# Patient Record
Sex: Female | Born: 1970 | Race: White | Hispanic: No | Marital: Married | Smoking: Former smoker
Health system: Southern US, Community
[De-identification: ages and names within clinical notes are randomized; demographics above are authoritative.]

## PROBLEM LIST (undated history)

## (undated) DIAGNOSIS — R87619 Unspecified abnormal cytological findings in specimens from cervix uteri: Secondary | ICD-10-CM

## (undated) HISTORY — PX: CERVICAL BIOPSY  W/ LOOP ELECTRODE EXCISION: SUR135

## (undated) HISTORY — DX: Unspecified abnormal cytological findings in specimens from cervix uteri: R87.619

## (undated) HISTORY — PX: BACK SURGERY: SHX140

## (undated) HISTORY — PX: TONSILLECTOMY: SHX5217

---

## 1999-07-22 HISTORY — PX: BREAST SURGERY: SHX581

## 2000-07-21 DIAGNOSIS — R87619 Unspecified abnormal cytological findings in specimens from cervix uteri: Secondary | ICD-10-CM

## 2000-07-21 HISTORY — DX: Unspecified abnormal cytological findings in specimens from cervix uteri: R87.619

## 2012-08-17 ENCOUNTER — Encounter (HOSPITAL_COMMUNITY): Payer: Self-pay | Admitting: *Deleted

## 2012-08-17 ENCOUNTER — Emergency Department (HOSPITAL_COMMUNITY)
Admission: EM | Admit: 2012-08-17 | Discharge: 2012-08-17 | Disposition: A | Discharge status: Home / Self Care | Payer: Self-pay

## 2012-08-17 NOTE — ED Notes (Signed)
Spoke with CN, [place], and [place], [facility].  States that since the patient was here for drug screening only and pt did not need medical screening.  Pt should not be billed, bill should be sent to [facility] [place].

## 2012-08-17 NOTE — ED Notes (Signed)
Pt here for pre-employment drug screen for [employer], no other complaints

## 2013-02-14 ENCOUNTER — Ambulatory Visit (INDEPENDENT_AMBULATORY_CARE_PROVIDER_SITE_OTHER): Payer: Managed Care, Other (non HMO) | Admitting: Gynecology

## 2013-02-14 ENCOUNTER — Encounter: Payer: Self-pay | Admitting: Gynecology

## 2013-02-14 VITALS — BP 100/64 | HR 68 | Temp 99.1°F | Resp 16 | Ht 66.25 in | Wt 154.0 lb

## 2013-02-14 DIAGNOSIS — Z01419 Encounter for gynecological examination (general) (routine) without abnormal findings: Secondary | ICD-10-CM

## 2013-02-14 DIAGNOSIS — K5900 Constipation, unspecified: Secondary | ICD-10-CM

## 2013-02-14 DIAGNOSIS — Z0000 Encounter for general adult medical examination without abnormal findings: Secondary | ICD-10-CM

## 2013-02-14 DIAGNOSIS — Z124 Encounter for screening for malignant neoplasm of cervix: Secondary | ICD-10-CM

## 2013-02-14 DIAGNOSIS — D259 Leiomyoma of uterus, unspecified: Secondary | ICD-10-CM

## 2013-02-14 LAB — POCT URINALYSIS DIPSTICK
Leukocytes, UA: NEGATIVE
Protein, UA: NEGATIVE
Urobilinogen, UA: NEGATIVE

## 2013-02-14 MED ORDER — NORETHIN-ETH ESTRAD-FE BIPHAS 1 MG-10 MCG / 10 MCG PO TABS: 1.0000 | ORAL_TABLET | Freq: Every day | ORAL | Status: DC

## 2013-02-14 NOTE — Progress Notes (Signed)
Patient ID: Pamela Chen, female   DOB: 1970/12/13, 42 y.o.   MRN: [id number] 42 y.o. MarriedCaucasian female   G0P0000 here for annual exam. Pt is currently sexually active.  Pt reports history of fibroids and menorrhagia.  Pt states that cycles are very well controled on LOLoestrin and is very happy with it.  Pt denies postcoiotal bleeding. Pt reports history of, eat constipation, had used miralax in past but did not see benefits, used to have bowel movements daily now it can be 1x/week eats a lot of fiber   No LMP recorded. Patient is not currently having periods (Reason: Irregular Periods).          Sexually active: yes  The current method of family planning is OCP (estrogen/progesterone).    Exercising: yes  Pilates at studio, pt teaches water fitness and spin class at [employer], biking and running, golfing, skiing on own time Last pap: 02/2012?, normal per patient, pt has had abnormal pap in past, neg HPV in past Alcohol: 1-2 drinks per week Tobacco: None BSE: sporadic Mammogram: normal, [facility] in [place].    No health maintenance topics applied.  Family History  Problem Relation Age of Onset  . Stroke Maternal Grandfather     There are no active problems to display for this patient.   History reviewed. No pertinent past medical history.  Past Surgical History  Procedure Laterality Date  . Back surgery  2010, 2013    discectomy, laminectomy  . Breast surgery Right 2001    breast reduction  . Tonsillectomy      Allergies: Cephalosporins and Tramadol  Current Outpatient Prescriptions  Medication Sig Dispense Refill  . LO LOESTRIN FE 1 MG-10 MCG / 10 MCG tablet Take 1 tablet by mouth daily. Take continuously      . NON FORMULARY AdvoCare Supplements daily.       No current facility-administered medications for this visit.    ROS: Pertinent items are noted in HPI.  Exam:    BP 100/64  Pulse 68  Temp(Src) 99.1 F (37.3 C) (Oral)  Resp 16  Ht  5' 6.25" (1.683 m)  Wt 154 lb (69.854 kg)  BMI 24.66 kg/m2 Weight change: @WEIGHTCHANGE @ Last 3 height recordings:  Ht Readings from Last 3 Encounters:  02/14/13 5' 6.25" (1.683 m)   General appearance: alert, cooperative and appears stated age Head: Normocephalic, without obvious abnormality, atraumatic Neck: no adenopathy, no carotid bruit, no JVD, supple, symmetrical, trachea midline and thyroid not enlarged, symmetric, no tenderness/mass/nodules Lungs: clear to auscultation bilaterally Breasts: normal appearance, no masses or tenderness Heart: regular rate and rhythm, S1, S2 normal, no murmur, click, rub or gallop Abdomen: soft, non-tender; bowel sounds normal; no masses,  no organomegaly Extremities: extremities normal, atraumatic, no cyanosis or edema Skin: Skin color, texture, turgor normal. No rashes or lesions Lymph nodes: Cervical, supraclavicular, and axillary nodes normal. no inguinal nodes palpated Neurologic: Grossly normal   Pelvic: External genitalia:  no lesions              Urethra: normal appearing urethra with no masses, tenderness or lesions              Bartholins and Skenes: normal                 Vagina: normal appearing vagina with normal color and discharge, no lesions              Cervix: normal appearance  Pap taken: no        Bimanual Exam:  Uterus:  uterus is normal size, shape, consistency and nontender, retroflexed with posterior fibroid                                      Adnexa:    normal adnexa in size, nontender and no masses                                      Rectovaginal: posterior fibroid, no stool in rectum                                      Anus:  normal sphincter tone, no lesions  A: well woman Contraceptive management  Fibroid uterus constipation     P: mammogram annual pap smear will get records of last PAP  U/s to evaluate fibroids, if fibroid is not source of constipation will refer to GI-Pt agreeable Refill  ocp return annually or prn   An After Visit Summary was printed and given to the patient.

## 2013-03-01 ENCOUNTER — Ambulatory Visit (INDEPENDENT_AMBULATORY_CARE_PROVIDER_SITE_OTHER): Payer: Managed Care, Other (non HMO)

## 2013-03-01 ENCOUNTER — Ambulatory Visit (INDEPENDENT_AMBULATORY_CARE_PROVIDER_SITE_OTHER): Payer: Managed Care, Other (non HMO) | Admitting: Gynecology

## 2013-03-01 VITALS — BP 100/60 | HR 72 | Resp 14 | Ht 66.25 in | Wt 152.0 lb

## 2013-03-01 DIAGNOSIS — K5900 Constipation, unspecified: Secondary | ICD-10-CM

## 2013-03-01 DIAGNOSIS — D259 Leiomyoma of uterus, unspecified: Secondary | ICD-10-CM

## 2013-03-01 MED ORDER — LINACLOTIDE 145 MCG PO CAPS: 145.0000 ug | ORAL_CAPSULE | Freq: Every day | ORAL | Status: DC

## 2013-03-01 MED ORDER — NORETHIN-ETH ESTRAD-FE BIPHAS 1 MG-10 MCG / 10 MCG PO TABS: 1.0000 | ORAL_TABLET | Freq: Every day | ORAL | Status: DC

## 2013-03-01 NOTE — Progress Notes (Signed)
     Images reviewed with pt, fibroids are small and not source of constipation.  Otherwise unremarkable. Pt eats a lot of fiber and has used stimulants, we suggest that she either see GI now or trial of Linzess, she prefers the later, 15m rx sent in, she will call and let us know how it works. 2 samples of LoLoestrin also given. PAP records na, will resend request

## 2013-03-03 NOTE — Addendum Note (Signed)
Addended by: Douglass Rivers on: 03/03/2013 04:12 PM   Modules accepted: Orders

## 2013-03-07 LAB — IPS PAP TEST WITH HPV

## 2013-05-04 ENCOUNTER — Encounter: Payer: Self-pay | Admitting: Gynecology

## 2014-02-20 ENCOUNTER — Ambulatory Visit: Payer: Managed Care, Other (non HMO) | Admitting: Gynecology

## 2014-03-05 ENCOUNTER — Other Ambulatory Visit: Payer: Self-pay | Admitting: Gynecology

## 2014-03-06 NOTE — Telephone Encounter (Signed)
Last refilled/AEX 02/14/13 #3 packs with 3 refills Aex scheduled for 03/22/14 with Dr. Allena Earing Loestrin 1/10 #84/0 refills sent to pharmacy to last patient until AEX

## 2014-03-21 ENCOUNTER — Ambulatory Visit: Payer: Managed Care, Other (non HMO) | Admitting: Gynecology

## 2014-03-22 ENCOUNTER — Ambulatory Visit (INDEPENDENT_AMBULATORY_CARE_PROVIDER_SITE_OTHER): Payer: Managed Care, Other (non HMO) | Admitting: Gynecology

## 2014-03-22 ENCOUNTER — Encounter: Payer: Self-pay | Admitting: Gynecology

## 2014-03-22 VITALS — BP 100/78 | HR 64 | Resp 12 | Ht 66.5 in | Wt 153.0 lb

## 2014-03-22 DIAGNOSIS — Z01419 Encounter for gynecological examination (general) (routine) without abnormal findings: Secondary | ICD-10-CM

## 2014-03-22 DIAGNOSIS — Z3041 Encounter for surveillance of contraceptive pills: Secondary | ICD-10-CM

## 2014-03-22 DIAGNOSIS — D259 Leiomyoma of uterus, unspecified: Secondary | ICD-10-CM

## 2014-03-22 DIAGNOSIS — Z0000 Encounter for general adult medical examination without abnormal findings: Secondary | ICD-10-CM

## 2014-03-22 DIAGNOSIS — K5900 Constipation, unspecified: Secondary | ICD-10-CM

## 2014-03-22 LAB — POCT URINALYSIS DIPSTICK
LEUKOCYTES UA: NEGATIVE
Urobilinogen, UA: NEGATIVE
pH, UA: 5

## 2014-03-22 LAB — HEMOGLOBIN, FINGERSTICK: HEMOGLOBIN, FINGERSTICK: 13.2 g/dL (ref 12.0–16.0)

## 2014-03-22 MED ORDER — NORETHIN-ETH ESTRAD-FE BIPHAS 1 MG-10 MCG / 10 MCG PO TABS: 1.0000 | ORAL_TABLET | Freq: Every day | ORAL | Status: DC

## 2014-03-22 NOTE — Patient Instructions (Signed)
otc magnesium supplements for constipation

## 2014-03-22 NOTE — Progress Notes (Signed)
43 y.o. Married Caucasian female   G0P0000 here for annual exam. Pt is currently sexually active.  Happy with ocp-no withdraw bleed. No dyspareunia.  No LMP recorded. Patient is not currently having periods (Reason: Irregular Periods).          Sexually active: Yes.    The current method of family planning is OCP (estrogen/progesterone).    Exercising: Yes.    crossfit, running, water fitness, biking 5x/wk Last pap: 02/14/13 NEG HR HPV Alcohol: 1-2 drinks/wk Tobacco: no BSE: no Mammogram: 04/11/13   Hgb: ; Urine: Negative  Health Maintenance  Topic Date Due  . Tetanus/tdap  01/19/1988  . Influenza Vaccine  02/18/2014  . Pap Smear  02/15/2016    Family History  Problem Relation Age of Onset  . Stroke Maternal Grandfather     Patient Active Problem List   Diagnosis Date Noted  . Fibroid uterus 02/14/2013  . Unspecified constipation 02/14/2013    No past medical history on file.  Past Surgical History  Procedure Laterality Date  . Back surgery  2010, 2013    discectomy, laminectomy  . Breast surgery Right 2001    breast reduction  . Tonsillectomy      Allergies: Cephalosporins and Tramadol  Current Outpatient Prescriptions  Medication Sig Dispense Refill  . LO LOESTRIN FE 1 MG-10 MCG / 10 MCG tablet TAKE 1 TABLET BY MOUTH ONCE DAILY CONTINUSOUSLY AS DIRECTED  84 tablet  0  . NON FORMULARY AdvoCare Supplements daily.      . Linaclotide (LINZESS) 145 MCG CAPS capsule Take 1 capsule (145 mcg total) by mouth daily.  30 capsule  1   No current facility-administered medications for this visit.    ROS: Pertinent items are noted in HPI.  Exam:    BP 100/78  Pulse 64  Resp 12  Ht 5' 6.5" (1.689 m)  Wt 153 lb (69.4 kg)  BMI 24.33 kg/m2 Weight change: @WEIGHTCHANGE @ Last 3 height recordings:  Ht Readings from Last 3 Encounters:  03/22/14 5' 6.5" (1.689 m)  03/01/13 5' 6.25" (1.683 m)  02/14/13 5' 6.25" (1.683 m)   General appearance: alert, cooperative and  appears stated age Head: Normocephalic, without obvious abnormality, atraumatic Neck: no adenopathy, no carotid bruit, no JVD, supple, symmetrical, trachea midline and thyroid not enlarged, symmetric, no tenderness/mass/nodules Lungs: clear to auscultation bilaterally Breasts: normal appearance, no masses or tenderness Heart: regular rate and rhythm, S1, S2 normal, no murmur, click, rub or gallop Abdomen: soft, non-tender; bowel sounds normal; no masses,  no organomegaly Extremities: extremities normal, atraumatic, no cyanosis or edema Skin: Skin color, texture, turgor normal. No rashes or lesions Lymph nodes: Cervical, supraclavicular, and axillary nodes normal. no inguinal nodes palpated Neurologic: Grossly normal   Pelvic: External genitalia:  no lesions              Urethra: normal appearing urethra with no masses, tenderness or lesions              Bartholins and Skenes: Bartholin's, Urethra, Skene's normal                 Vagina: normal appearing vagina with normal color and discharge, no lesions              Cervix: normal appearance              Pap taken: No.        Bimanual Exam:  Uterus:  uterus is normal size, shape, consistency and nontender  Adnexa:    no masses                                      Rectovaginal: Confirms                                      Anus:  normal sphincter tone, no lesions       1. Routine gynecological examination counseled on breast self exam, mammography screening, use and side effects of OCP's, adequate intake of calcium and vitamin D, diet and exercise return annually or prn   2. Laboratory examination ordered as part of a routine general medical examination - POCT Urinalysis Dipstick - Hemoglobin, fingerstick  3. Unspecified constipation Recommend otc magnesium supplements for constipation 4. Encounter for surveillance of contraceptive pills  - Norethindrone-Ethinyl Estradiol-Fe Biphas (LO  LOESTRIN FE) 1 MG-10 MCG / 10 MCG tablet; Take 1 tablet by mouth daily.  Dispense: 3 Package; Refill: 3  5. Uterine leiomyoma, unspecified location  - Norethindrone-Ethinyl Estradiol-Fe Biphas (LO LOESTRIN FE) 1 MG-10 MCG / 10 MCG tablet; Take 1 tablet by mouth daily.  Dispense: 3 Package; Refill: 3  An After Visit Summary was printed and given to the patient.

## 2014-06-21 ENCOUNTER — Telehealth: Payer: Self-pay

## 2014-06-21 NOTE — Telephone Encounter (Signed)
Spoke with pt and has reschedule her AEX with Kem Boroughs on 04/02/15

## 2015-03-27 ENCOUNTER — Ambulatory Visit: Payer: Managed Care, Other (non HMO) | Admitting: Gynecology

## 2015-04-02 ENCOUNTER — Encounter: Payer: Self-pay | Admitting: Nurse Practitioner

## 2015-04-02 ENCOUNTER — Ambulatory Visit (INDEPENDENT_AMBULATORY_CARE_PROVIDER_SITE_OTHER): Payer: Managed Care, Other (non HMO) | Admitting: Nurse Practitioner

## 2015-04-02 VITALS — BP 100/72 | HR 60 | Resp 14 | Ht 66.5 in | Wt 154.0 lb

## 2015-04-02 DIAGNOSIS — D259 Leiomyoma of uterus, unspecified: Secondary | ICD-10-CM

## 2015-04-02 DIAGNOSIS — Z3041 Encounter for surveillance of contraceptive pills: Secondary | ICD-10-CM | POA: Diagnosis not present

## 2015-04-02 DIAGNOSIS — Z01419 Encounter for gynecological examination (general) (routine) without abnormal findings: Secondary | ICD-10-CM | POA: Diagnosis not present

## 2015-04-02 DIAGNOSIS — Z23 Encounter for immunization: Secondary | ICD-10-CM

## 2015-04-02 DIAGNOSIS — Z0000 Encounter for general adult medical examination without abnormal findings: Secondary | ICD-10-CM

## 2015-04-02 LAB — POCT URINALYSIS DIPSTICK
BILIRUBIN UA: NEGATIVE
GLUCOSE UA: NEGATIVE
KETONES UA: NEGATIVE
Leukocytes, UA: NEGATIVE
Nitrite, UA: NEGATIVE
Protein, UA: NEGATIVE
RBC UA: 50
Urobilinogen, UA: NEGATIVE
pH, UA: 5

## 2015-04-02 MED ORDER — NORETHIN-ETH ESTRAD-FE BIPHAS 1 MG-10 MCG / 10 MCG PO TABS: 1.0000 | ORAL_TABLET | Freq: Every day | ORAL | Status: DC

## 2015-04-02 NOTE — Progress Notes (Signed)
44 y.o. G0 Married  Caucasian Fe here for annual exam.  On Lo Loestrin without menses for several years.  Did have a spotting episode within the last year.  Will get increase in warmth especially at night. No vaso symptoms during the day.    Patient's last menstrual period was 07/21/2013 (approximate).          Sexually active: Yes.    The current method of family planning is OCP (estrogen/progesterone).    Exercising: Yes.    crossfit, water fitness, pilates Smoker:  no  Health Maintenance: Pap:  02/14/13 normal, HR HPV neg MMG:  04/14/14 3D dense category c, bi-rads category 1 neg TDaP:  Patient is unsure.   Labs: Patient had labs drawn at work.  UA: RBC 50, Ph 5.0   reports that she quit smoking about 22 years ago. Her smoking use included Cigarettes. She has never used smokeless tobacco. She reports that she drinks about 0.5 - 1.0 oz of alcohol per week. She reports that she does not use illicit drugs.  History reviewed. No pertinent past medical history.  Past Surgical History  Procedure Laterality Date  . Back surgery  2010, 2013    discectomy, laminectomy  . Breast surgery Right 2001    breast reduction  . Tonsillectomy      Current Outpatient Prescriptions  Medication Sig Dispense Refill  . NON FORMULARY AdvoCare Supplements daily.    . Norethindrone-Ethinyl Estradiol-Fe Biphas (LO LOESTRIN FE) 1 MG-10 MCG / 10 MCG tablet Take 1 tablet by mouth daily. 3 Package 3   No current facility-administered medications for this visit.    Family History  Problem Relation Age of Onset  . Stroke Maternal Grandfather     ROS:  Pertinent items are noted in HPI.  Otherwise, a comprehensive ROS was negative.  Exam:   BP 100/72 mmHg  Pulse 60  Resp 14  Ht 5' 6.5" (1.689 m)  Wt 154 lb (69.854 kg)  BMI 24.49 kg/m2  LMP 07/21/2013 (Approximate) Height: 5' 6.5" (168.9 cm) Ht Readings from Last 3 Encounters:  04/02/15 5' 6.5" (1.689 m)  03/22/14 5' 6.5" (1.689 m)  03/01/13 5'  6.25" (1.683 m)    General appearance: alert, cooperative and appears stated age Head: Normocephalic, without obvious abnormality, atraumatic Neck: no adenopathy, supple, symmetrical, trachea midline and thyroid normal to inspection and palpation Lungs: clear to auscultation bilaterally Breasts: normal appearance, no masses or tenderness, surgical changes on the right Heart: regular rate and rhythm Abdomen: soft, non-tender; no masses,  no organomegaly Extremities: extremities normal, atraumatic, no cyanosis or edema Skin: Skin color, texture, turgor normal. No rashes or lesions Lymph nodes: Cervical, supraclavicular, and axillary nodes normal. No abnormal inguinal nodes palpated Neurologic: Grossly normal   Pelvic: External genitalia:  no lesions              Urethra:  normal appearing urethra with no masses, tenderness or lesions              Bartholin's and Skene's: normal                 Vagina: normal appearing vagina with normal color and discharge, no lesions              Cervix: anteverted              Pap taken: No. Bimanual Exam:  Uterus:  normal size, contour, position, consistency, mobility, non-tender  Adnexa: no mass, fullness, tenderness               Rectovaginal: Confirms               Anus:  normal sphincter tone, no lesions  Chaperone present: no  A:  Well Woman with normal exam  Contraception with amenorrhea on OCP  Update immunization  History of uterine fibroids with normal pelvic exam   P:   Reviewed health and wellness pertinent to exam  Pap smear as above  Mammogram is due 9/16  TDaP is given today  Refill on Lo Loestrin for a year  Counseled on breast self exam, mammography screening, use and side effects of OCP's, adequate intake of calcium and vitamin D, diet and exercise, Kegel's exercises return annually or prn  An After Visit Summary was printed and given to the patient.

## 2015-04-02 NOTE — Patient Instructions (Signed)

## 2015-04-04 NOTE — Progress Notes (Signed)
Encounter reviewed by Dr. Vantasia Pinkney Amundson C. Silva.  

## 2015-04-11 ENCOUNTER — Other Ambulatory Visit: Payer: Self-pay

## 2016-03-16 ENCOUNTER — Other Ambulatory Visit: Payer: Self-pay | Admitting: Nurse Practitioner

## 2016-03-16 DIAGNOSIS — D259 Leiomyoma of uterus, unspecified: Secondary | ICD-10-CM

## 2016-03-16 DIAGNOSIS — Z3041 Encounter for surveillance of contraceptive pills: Secondary | ICD-10-CM

## 2016-03-17 NOTE — Telephone Encounter (Signed)
Medication refill request: OCP Last AEX:  04-02-15 Next AEX: 04-07-16 Last MMG (if hormonal medication request): 04-18-15 WNL  Refill authorized: Please advise

## 2016-04-02 ENCOUNTER — Encounter: Payer: Self-pay | Admitting: Nurse Practitioner

## 2016-04-07 ENCOUNTER — Encounter: Payer: Self-pay | Admitting: Nurse Practitioner

## 2016-04-07 ENCOUNTER — Ambulatory Visit (INDEPENDENT_AMBULATORY_CARE_PROVIDER_SITE_OTHER): Payer: Managed Care, Other (non HMO) | Admitting: Nurse Practitioner

## 2016-04-07 VITALS — BP 98/66 | HR 56 | Ht 66.5 in | Wt 158.0 lb

## 2016-04-07 DIAGNOSIS — Z01419 Encounter for gynecological examination (general) (routine) without abnormal findings: Secondary | ICD-10-CM

## 2016-04-07 DIAGNOSIS — Z0000 Encounter for general adult medical examination without abnormal findings: Secondary | ICD-10-CM

## 2016-04-07 DIAGNOSIS — R3915 Urgency of urination: Secondary | ICD-10-CM

## 2016-04-07 DIAGNOSIS — D259 Leiomyoma of uterus, unspecified: Secondary | ICD-10-CM

## 2016-04-07 DIAGNOSIS — Z3041 Encounter for surveillance of contraceptive pills: Secondary | ICD-10-CM

## 2016-04-07 DIAGNOSIS — N760 Acute vaginitis: Secondary | ICD-10-CM

## 2016-04-07 LAB — COMPREHENSIVE METABOLIC PANEL
ALK PHOS: 42 U/L (ref 33–115)
ALT: 16 U/L (ref 6–29)
AST: 18 U/L (ref 10–35)
Albumin: 4 g/dL (ref 3.6–5.1)
BUN: 16 mg/dL (ref 7–25)
CO2: 28 mmol/L (ref 20–31)
CREATININE: 0.79 mg/dL (ref 0.50–1.10)
Calcium: 9.6 mg/dL (ref 8.6–10.2)
Chloride: 105 mmol/L (ref 98–110)
Glucose, Bld: 95 mg/dL (ref 65–99)
POTASSIUM: 5.3 mmol/L (ref 3.5–5.3)
Sodium: 141 mmol/L (ref 135–146)
TOTAL PROTEIN: 6.5 g/dL (ref 6.1–8.1)
Total Bilirubin: 0.5 mg/dL (ref 0.2–1.2)

## 2016-04-07 LAB — URINALYSIS, MICROSCOPIC ONLY
Casts: NONE SEEN [LPF]
Crystals: NONE SEEN [HPF]
YEAST: NONE SEEN [HPF]

## 2016-04-07 LAB — POCT URINALYSIS DIPSTICK
Bilirubin, UA: NEGATIVE
Glucose, UA: NEGATIVE
KETONES UA: NEGATIVE
Nitrite, UA: NEGATIVE
PH UA: 8
PROTEIN UA: NEGATIVE
Urobilinogen, UA: NEGATIVE

## 2016-04-07 LAB — TSH: TSH: 2.08 mIU/L

## 2016-04-07 MED ORDER — NORETHIN-ETH ESTRAD-FE BIPHAS 1 MG-10 MCG / 10 MCG PO TABS: 1.0000 | ORAL_TABLET | Freq: Every day | ORAL | 4 refills | Status: DC

## 2016-04-07 NOTE — Progress Notes (Signed)
Patient ID: Pamela Chen, female   DOB: [birth year], 45 y.o.   MRN: [id number]  45 y.o. G0P0000 Married  Caucasian Fe here for annual exam.  No new health problems.  Current URI and on Z pack.  Some vaginal discharge.  She also is having pelvic pressure with SA for about 4-6 months, seems to be positional.  She has not been exercising for about 3 weeks.  Patient's last menstrual period was 07/21/2013 (approximate).          Sexually active: Yes.    The current method of family planning is oral estrogen/progesterone contraceptive.    Exercising: Yes.    Gym/ health club routine includes water fitness, CrossFit, biking, running. Smoker:  no  Health Maintenance: Pap:  02/14/13 normal, HR HPV neg MMG: 03/31/16, 3D, Bi-Rads 1: Negative TDaP: 04/02/15 HIV: has previously donated blood Labs: Wellness program at work  Urine: trace RBC & Leuk's   reports that she quit smoking about 23 years ago. Her smoking use included Cigarettes. She has never used smokeless tobacco. She reports that she drinks about 0.5 - 1.0 oz of alcohol per week . She reports that she does not use drugs.  History reviewed. No pertinent past medical history.  Past Surgical History:  Procedure Laterality Date  . BACK SURGERY  2010, 2013   discectomy, laminectomy  . BREAST SURGERY Right 2001   breast reduction  . TONSILLECTOMY      Current Outpatient Prescriptions  Medication Sig Dispense Refill  . LO LOESTRIN FE 1 MG-10 MCG / 10 MCG tablet TAKE 1 TABLET BY MOUTH DAILY. 84 tablet 0  . NON FORMULARY AdvoCare Supplements daily.     No current facility-administered medications for this visit.     Family History  Problem Relation Age of Onset  . Stroke Maternal Grandfather   . Breast cancer Maternal Aunt     lumpectomy, chemo, radiation    ROS:  Pertinent items are noted in HPI.  Otherwise, a comprehensive ROS was negative.  Exam:   BP 98/66 (BP Location: Right Arm, Patient Position: Sitting, Cuff Size: Normal)    Pulse (!) 56   Ht 5' 6.5" (1.689 m)   Wt 158 lb (71.7 kg)   LMP 07/21/2013 (Approximate)   BMI 25.12 kg/m  Height: 5' 6.5" (168.9 cm) Ht Readings from Last 3 Encounters:  04/07/16 5' 6.5" (1.689 m)  04/02/15 5' 6.5" (1.689 m)  03/22/14 5' 6.5" (1.689 m)    General appearance: alert, cooperative and appears stated age Head: Normocephalic, without obvious abnormality, atraumatic Neck: no adenopathy, supple, symmetrical, trachea midline and thyroid normal to inspection and palpation Lungs: clear to auscultation bilaterally Breasts: normal appearance, no masses or tenderness Heart: regular rate and rhythm Abdomen: soft, non-tender; no masses,  no organomegaly Extremities: extremities normal, atraumatic, no cyanosis or edema Skin: Skin color, texture, turgor normal. No rashes or lesions Lymph nodes: Cervical, supraclavicular, and axillary nodes normal. No abnormal inguinal nodes palpated Neurologic: Grossly normal   Pelvic: External genitalia:  no lesions              Urethra:  normal appearing urethra with no masses, tenderness or lesions              Bartholin's and Skene's: normal                 Vagina: normal appearing vagina with normal color and white thin discharge, no lesions  Cervix: anteverted              Pap taken: Yes.   Bimanual Exam:  Uterus:  normal size, contour, position, consistency, mobility, non-tender              Adnexa: no mass, fullness, tenderness               Rectovaginal: Confirms               Anus:  normal sphincter tone, no lesions  Chaperone present: yes  A:  Well Woman with normal exam  OCP for menorrhagia and cycle regulation  Pelvic pressure with SA X 4-6 months  History of uterine fibroid and adenomyosis per PUS 2014  P:   Reviewed health and wellness pertinent to exam  Pap smear as above  Mammogram is due 03/2017  Follow with labs and Affirm  Refill Lo Loestrin for a year  Will get PUS and follow  Counseled on breast self  exam, mammography screening, use and side effects of OCP's, adequate intake of calcium and vitamin D, diet and exercise, Kegel's exercises return annually or prn  An After Visit Summary was printed and given to the patient.

## 2016-04-07 NOTE — Patient Instructions (Signed)

## 2016-04-08 ENCOUNTER — Ambulatory Visit: Payer: Managed Care, Other (non HMO)

## 2016-04-08 ENCOUNTER — Other Ambulatory Visit: Payer: Managed Care, Other (non HMO) | Admitting: Obstetrics and Gynecology

## 2016-04-08 ENCOUNTER — Other Ambulatory Visit: Payer: Self-pay

## 2016-04-08 ENCOUNTER — Telehealth: Payer: Self-pay | Admitting: Obstetrics and Gynecology

## 2016-04-08 DIAGNOSIS — D259 Leiomyoma of uterus, unspecified: Secondary | ICD-10-CM

## 2016-04-08 LAB — URINE CULTURE: ORGANISM ID, BACTERIA: NO GROWTH

## 2016-04-08 LAB — WET PREP BY MOLECULAR PROBE
CANDIDA SPECIES: NEGATIVE
GARDNERELLA VAGINALIS: NEGATIVE
TRICHOMONAS VAG: NEGATIVE

## 2016-04-08 LAB — VITAMIN D 25 HYDROXY (VIT D DEFICIENCY, FRACTURES): Vit D, 25-Hydroxy: 37 ng/mL (ref 30–100)

## 2016-04-08 NOTE — Telephone Encounter (Signed)
Patient canceled her PUS/Consult appointment today due to delay in PUS schedule today. Patient would like to be reschedule.

## 2016-04-09 LAB — IPS PAP TEST WITH HPV

## 2016-04-09 NOTE — Telephone Encounter (Signed)
Spoke with patient, she has rescheduled ultrasound appointment for 04/15/16 with Dr Talbert Nan. Patient is aware of the date and time.  Routing to Dr Talbert Nan for review

## 2016-04-10 NOTE — Telephone Encounter (Signed)
Left a message requesting patient to return call to our office. Due to a conflict with the ultrasound schedule we will need to reschedule appointment for 04/15/16.

## 2016-04-11 NOTE — Telephone Encounter (Signed)
Spoke with patient regarding ultrasound scheduled. Patient is agreeable to to re-scheduling. Patient has re-scheduled on 04/17/16 with Dr Talbert Nan.  Routing to Dr Talbert Nan to review

## 2016-04-11 NOTE — Progress Notes (Signed)
Encounter reviewed by Dr. Brook Amundson C. Silva.  

## 2016-04-15 ENCOUNTER — Other Ambulatory Visit: Payer: Managed Care, Other (non HMO) | Admitting: Obstetrics and Gynecology

## 2016-04-15 ENCOUNTER — Other Ambulatory Visit: Payer: Managed Care, Other (non HMO)

## 2016-04-17 ENCOUNTER — Ambulatory Visit (INDEPENDENT_AMBULATORY_CARE_PROVIDER_SITE_OTHER): Payer: Managed Care, Other (non HMO)

## 2016-04-17 ENCOUNTER — Encounter: Payer: Self-pay | Admitting: Obstetrics and Gynecology

## 2016-04-17 ENCOUNTER — Ambulatory Visit (INDEPENDENT_AMBULATORY_CARE_PROVIDER_SITE_OTHER): Payer: Managed Care, Other (non HMO) | Admitting: Obstetrics and Gynecology

## 2016-04-17 VITALS — BP 112/70 | HR 72 | Resp 16 | Ht 66.5 in | Wt 158.4 lb

## 2016-04-17 DIAGNOSIS — N83202 Unspecified ovarian cyst, left side: Secondary | ICD-10-CM

## 2016-04-17 DIAGNOSIS — R3915 Urgency of urination: Secondary | ICD-10-CM

## 2016-04-17 DIAGNOSIS — N3281 Overactive bladder: Secondary | ICD-10-CM

## 2016-04-17 DIAGNOSIS — D259 Leiomyoma of uterus, unspecified: Secondary | ICD-10-CM

## 2016-04-17 DIAGNOSIS — N3941 Urge incontinence: Secondary | ICD-10-CM

## 2016-04-17 DIAGNOSIS — N941 Unspecified dyspareunia: Secondary | ICD-10-CM

## 2016-04-17 LAB — POCT URINALYSIS DIPSTICK
Bilirubin, UA: NEGATIVE
Blood, UA: 1
GLUCOSE UA: NEGATIVE
Ketones, UA: NEGATIVE
Leukocytes, UA: NEGATIVE
Nitrite, UA: NEGATIVE
Protein, UA: NEGATIVE
UROBILINOGEN UA: NEGATIVE
pH, UA: 7

## 2016-04-17 NOTE — Patient Instructions (Signed)
Kegel Exercises  The goal of Kegel exercises is to isolate and exercise your pelvic floor muscles. These muscles act as a hammock that supports the rectum, vagina, small intestine, and uterus. As the muscles weaken, the hammock sags and these organs are displaced from their normal positions. Kegel exercises can strengthen your pelvic floor muscles and help you to improve bladder and bowel control, improve sexual response, and help reduce many problems and some discomfort during pregnancy. Kegel exercises can be done anywhere and at any time.  HOW TO PERFORM KEGEL EXERCISES  1. Locate your pelvic floor muscles. To do this, squeeze (contract) the muscles that you use when you try to stop the flow of urine. You will feel a tightness in the vaginal area (women) and a tight lift in the rectal area (men and women).  2. When you begin, contract your pelvic muscles tight for 2-5 seconds, then relax them for 2-5 seconds. This is one set. Do 4-5 sets with a short pause in between.  3. Contract your pelvic muscles for 8-10 seconds, then relax them for 8-10 seconds. Do 4-5 sets. If you cannot contract your pelvic muscles for 8-10 seconds, try 5-7 seconds and work your way up to 8-10 seconds. Your goal is 4-5 sets of 10 contractions each day.  Keep your stomach, buttocks, and legs relaxed during the exercises. Perform sets of both short and long contractions. Vary your positions. Perform these contractions 3-4 times per day. Perform sets while you are:    · Lying in bed in the morning.  · Standing at lunch.  · Sitting in the late afternoon.  · Lying in bed at night.   You should do 40-50 contractions per day. Do not perform more Kegel exercises per day than recommended. Overexercising can cause muscle fatigue. Continue these exercises for for at least 15-20 weeks or as directed by your caregiver.     This information is not intended to replace advice given to you by your health care provider. Make sure you discuss any questions  you have with your health care provider.     Document Released: 06/23/2012 Document Revised: 07/28/2014 Document Reviewed: 06/23/2012  [employer]vier Interactive Patient Education ©2016 [employer]

## 2016-04-17 NOTE — Progress Notes (Addendum)
GYNECOLOGY  VISIT   HPI: 45 y.o.   Married  Caucasian  female   G0P0000 with Patient's last menstrual period was 07/21/2013 (approximate).   here for  Ultrasound for dyspareunia.  She c/o deep dyspareunia in the last 6 months, it is positional (mostly). She is PMP. She also c/o a 6 month h/o feeling like she has to go to the bathroom right after she goes to the bathroom while she is having sex.  Can start leaking as she is getting her pants down (again in the last 6 months). She voids normal amount, has urgency to void, no increased frequency.  She is on OCP's and just has occasional spotting. She wasn't feeling tender during her recent pelvic exam.  She drinks 1-2 cups of coffee a day. Sometimes she doesn't drink any caffeine.  Husband with low libido, only recently on allergy meds, no medical issues. Marriage otherwise good. Asking about seeing a sex therapist   GYNECOLOGIC HISTORY: Patient's last menstrual period was 07/21/2013 (approximate). Contraception:OCP Menopausal hormone therapy: n/a        OB History    Gravida Para Term Preterm AB Living   0 0 0 0 0 0   SAB TAB Ectopic Multiple Live Births   0 0 0 0 0         Patient Active Problem List   Diagnosis Date Noted  . Fibroid uterus 02/14/2013  . Unspecified constipation 02/14/2013    Past Medical History:  Diagnosis Date  . Abnormal Pap smear of cervix 2002   while in [place] had a colpo biopsy and follow since then has been normal.    Past Surgical History:  Procedure Laterality Date  . BACK SURGERY  2010, 2013   discectomy, laminectomy  . BREAST SURGERY Right 2001    due to abnormal size  . TONSILLECTOMY  child    Current Outpatient Prescriptions  Medication Sig Dispense Refill  . NON FORMULARY AdvoCare Supplements daily.    . Norethindrone-Ethinyl Estradiol-Fe Biphas (LO LOESTRIN FE) 1 MG-10 MCG / 10 MCG tablet Take 1 tablet by mouth daily. 84 tablet 4   No current facility-administered medications for  this visit.      ALLERGIES: Cephalosporins and Tramadol  Family History  Problem Relation Age of Onset  . Migraines Mother   . Stroke Maternal Grandfather   . Breast cancer Maternal Aunt 60    lumpectomy, chemo, radiation    Social History   Social History  . Marital status: Married    Spouse name: N/A  . Number of children: 0  . Years of education: N/A   Occupational History  .  Other   Social History Main Topics  . Smoking status: Former Smoker    Types: Cigarettes    Quit date: 02/14/1993  . Smokeless tobacco: Never Used     Comment: social smoker until 2004  . Alcohol use 0.5 - 1.0 oz/week    1 - 2 Standard drinks or equivalent per week  . Drug use: No  . Sexual activity: Yes    Partners: Male    Birth control/ protection: Pill   Other Topics Concern  . Not on file   Social History Narrative  . No narrative on file    Review of Systems  Constitutional: Negative.   HENT: Negative.   Eyes: Negative.   Respiratory: Negative.   Cardiovascular: Negative.   Gastrointestinal: Negative.   Genitourinary: Positive for urgency.  Musculoskeletal: Negative.   Skin: Negative.  Neurological: Negative.   Endo/Heme/Allergies: Negative.   Psychiatric/Behavioral: Negative.     PHYSICAL EXAMINATION:    BP 112/70 (BP Location: Right Arm, Patient Position: Sitting, Cuff Size: Normal)   Pulse 72   Resp 16   Ht 5' 6.5" (1.689 m)   Wt 158 lb 6.4 oz (71.8 kg)   LMP 07/21/2013 (Approximate)   BMI 25.18 kg/m     General appearance: alert, cooperative and appears stated age  ASSESSMENT Dyspareunia over the last 6 months (postiional) Left ovarian cyst, simple over 4 cm Urinary urgency and urge incontinence, recent negative urine culture Hematuria Husband with low libido, no medications (other than recent allergy meds)    PLAN F/U for a repeat ultrasound in 2 months Send urine for micro ua, if still + blood will send to urology Discussed cutting back on her  caffeine Discussed the option of PT or medication for OAB, she declines medication Discussed Kegels and bladder training She will call if she wants a referral to PT Given # for Sex Therapist   An After Visit Summary was printed and given to the patient.  15 minutes face to face time of which over 50% was spent in counseling.    06/23/16 Addendum: The patient was seen by Dr Karsten Ro in Urology. She had a negative cystoscopy and CTscan. He recommended yearly urinalysis and serum creatinine for 3-5 years. If she develops HTN, proteinuria or blood cell casts on ua, she needs further evaluation. If hematuria persists consider reevaluation. If 2 negative urinalysis's no further w/u needed.

## 2016-04-18 LAB — URINALYSIS, MICROSCOPIC ONLY
Bacteria, UA: NONE SEEN [HPF]
CASTS: NONE SEEN [LPF]
Crystals: NONE SEEN [HPF]
Squamous Epithelial / LPF: NONE SEEN [HPF] (ref <=5)
WBC UA: NONE SEEN WBC/HPF (ref <=5)
Yeast: NONE SEEN [HPF]

## 2016-04-23 ENCOUNTER — Telehealth: Payer: Self-pay

## 2016-04-23 DIAGNOSIS — R319 Hematuria, unspecified: Secondary | ICD-10-CM

## 2016-04-23 NOTE — Telephone Encounter (Signed)
Left message to call Hurt at [phone].  Referral has been placed to [facility].

## 2016-04-23 NOTE — Telephone Encounter (Signed)
-----   Message from Salvadore Dom, MD sent at 04/21/2016  4:30 PM EDT ----- Please inform the patient that her urine is still + for blood and set her up to see urology. She was told this would happen if there was still blood.

## 2016-04-24 NOTE — Telephone Encounter (Signed)
Spoke with patient. Advised of results as seen below from [place]. Patient is agreeable and verbalizes understanding. Referral placed to [facility]. Patient is aware that our referrals coordinator will work with [facility] to get this appointment schedule. Advised she will be notified of appointment date and time.  Cc: Theresia Lo regarding referral  Routing to provider for final review. Patient agreeable to disposition. Will close encounter.

## 2016-05-01 NOTE — Telephone Encounter (Signed)
Ultrasound appointment completed on 04/17/16. Ok to close.

## 2016-05-05 ENCOUNTER — Telehealth: Payer: Self-pay | Admitting: Obstetrics and Gynecology

## 2016-05-05 NOTE — Telephone Encounter (Signed)
Patient is asking for status of her referral to [facility].

## 2016-05-05 NOTE — Telephone Encounter (Signed)
Left voicemail regarding referral appointment. The information is listed below. Should the patient need to cancel or reschedule this appointment, please advise them to call the office they've been referred to in order to reschedule.  [facility] [place] 2nd floor [place]  05/22/16 (November 2nd) @ 9am with Dr Karsten Ro. Please arrive 15 minutes early with insurance card, photo id and list of medications.

## 2016-06-17 ENCOUNTER — Encounter: Payer: Self-pay | Admitting: Obstetrics and Gynecology

## 2016-06-17 ENCOUNTER — Other Ambulatory Visit: Payer: Managed Care, Other (non HMO) | Admitting: Obstetrics and Gynecology

## 2016-06-17 ENCOUNTER — Ambulatory Visit (INDEPENDENT_AMBULATORY_CARE_PROVIDER_SITE_OTHER): Payer: Managed Care, Other (non HMO)

## 2016-06-17 ENCOUNTER — Other Ambulatory Visit: Payer: Self-pay | Admitting: Obstetrics and Gynecology

## 2016-06-17 ENCOUNTER — Ambulatory Visit (INDEPENDENT_AMBULATORY_CARE_PROVIDER_SITE_OTHER): Payer: Managed Care, Other (non HMO) | Admitting: Obstetrics and Gynecology

## 2016-06-17 ENCOUNTER — Other Ambulatory Visit: Payer: Managed Care, Other (non HMO)

## 2016-06-17 VITALS — BP 102/60 | HR 60 | Resp 12 | Wt 160.0 lb

## 2016-06-17 DIAGNOSIS — N83202 Unspecified ovarian cyst, left side: Secondary | ICD-10-CM

## 2016-06-17 NOTE — Progress Notes (Signed)
    GYNECOLOGY  VISIT   HPI: 45 y.o.   Married  Caucasian  female   G0P0000 with No LMP recorded. Patient is not currently having periods (Reason: Oral contraceptives).   here for follow up left ovarian cyst. The patient is without c/o. Dyspareunia has resolved.   GYNECOLOGIC HISTORY: No LMP recorded. Patient is not currently having periods (Reason: Oral contraceptives). Contraception:OCP Menopausal hormone therapy: none         OB History    Gravida Para Term Preterm AB Living   0 0 0 0 0 0   SAB TAB Ectopic Multiple Live Births   0 0 0 0 0         Patient Active Problem List   Diagnosis Date Noted  . Fibroid uterus 02/14/2013  . Unspecified constipation 02/14/2013    Past Medical History:  Diagnosis Date  . Abnormal Pap smear of cervix 2002   while in [place] had a colpo biopsy and follow since then has been normal.    Past Surgical History:  Procedure Laterality Date  . BACK SURGERY  2010, 2013   discectomy, laminectomy  . BREAST SURGERY Right 2001    due to abnormal size  . TONSILLECTOMY  child    Current Outpatient Prescriptions  Medication Sig Dispense Refill  . NON FORMULARY AdvoCare Supplements daily.    . Norethindrone-Ethinyl Estradiol-Fe Biphas (LO LOESTRIN FE) 1 MG-10 MCG / 10 MCG tablet Take 1 tablet by mouth daily. 84 tablet 4   No current facility-administered medications for this visit.      ALLERGIES: Cephalosporins and Tramadol  Family History  Problem Relation Age of Onset  . Migraines Mother   . Stroke Maternal Grandfather   . Breast cancer Maternal Aunt 60    lumpectomy, chemo, radiation    Social History   Social History  . Marital status: Married    Spouse name: N/A  . Number of children: 0  . Years of education: N/A   Occupational History  .  Other   Social History Main Topics  . Smoking status: Former Smoker    Types: Cigarettes    Quit date: 02/14/1993  . Smokeless tobacco: Never Used     Comment: social smoker until  2004  . Alcohol use 0.5 - 1.0 oz/week    1 - 2 Standard drinks or equivalent per week  . Drug use: No  . Sexual activity: Yes    Partners: Male    Birth control/ protection: Pill   Other Topics Concern  . Not on file   Social History Narrative  . No narrative on file    Review of Systems  Constitutional: Negative.   HENT: Negative.   Eyes: Negative.   Respiratory: Negative.   Cardiovascular: Negative.   Gastrointestinal: Negative.   Genitourinary: Negative.   Musculoskeletal: Negative.   Skin: Negative.   Neurological: Negative.   Endo/Heme/Allergies: Negative.   Psychiatric/Behavioral: Negative.     PHYSICAL EXAMINATION:    BP 102/60 (BP Location: Right Arm, Patient Position: Sitting, Cuff Size: Normal)   Pulse 60   Resp 12   Wt 160 lb (72.6 kg)   BMI 25.44 kg/m     General appearance: alert, cooperative and appears stated age  Ultrasound images reviewed, normal  ASSESSMENT F/U for left ovarian cyst, resolved. Dyspareunia, resolved.   PLAN Routine f/u   An After Visit Summary was printed and given to the patient.

## 2017-04-10 ENCOUNTER — Ambulatory Visit: Payer: Managed Care, Other (non HMO) | Admitting: Nurse Practitioner

## 2017-04-14 ENCOUNTER — Encounter: Payer: Self-pay | Admitting: Certified Nurse Midwife

## 2017-04-14 ENCOUNTER — Ambulatory Visit (INDEPENDENT_AMBULATORY_CARE_PROVIDER_SITE_OTHER): Payer: Commercial Managed Care - PPO | Admitting: Certified Nurse Midwife

## 2017-04-14 VITALS — BP 92/60 | HR 54 | Resp 14 | Ht 66.5 in | Wt 159.0 lb

## 2017-04-14 DIAGNOSIS — Z01419 Encounter for gynecological examination (general) (routine) without abnormal findings: Secondary | ICD-10-CM | POA: Diagnosis not present

## 2017-04-14 DIAGNOSIS — Z3041 Encounter for surveillance of contraceptive pills: Secondary | ICD-10-CM

## 2017-04-14 NOTE — Progress Notes (Signed)
46 y.o. G0P0000 Married  Caucasian Fe here for annual exam. Periods none with OCP use. Some increase in discharge around period time only. Happy with choice. Had lab work recently with company, all normal per patient. Will send a copy here. No health issues today. Staying busy with work and exercise, running some, but not as before in [place].   No LMP recorded. Patient is not currently having periods (Reason: Oral contraceptives).          Sexually active: Yes.    The current method of family planning is OCP (estrogen/progesterone).    Exercising: Yes.    crossfit, aquafit, pilates Smoker:  no  Health Maintenance: Pap:  02-14-13 neg HPV HR neg, 04-07-16 neg HPV HR neg History of Abnormal Pap: yes MMG:  03-31-16 category c density birads 1:neg, 2018 per patient, copy requested Self Breast exams: no Colonoscopy:  none BMD:   none TDaP:  2016 Shingles: no Pneumonia: no Hep C and HIV: donated blood Labs: if needed   reports that she quit smoking about 24 years ago. Her smoking use included Cigarettes. She has never used smokeless tobacco. She reports that she drinks about 0.5 - 1.0 oz of alcohol per week . She reports that she does not use drugs.  Past Medical History:  Diagnosis Date  . Abnormal Pap smear of cervix 2002   while in [place] had a colpo biopsy and follow since then has been normal.    Past Surgical History:  Procedure Laterality Date  . BACK SURGERY  2010, 2013   discectomy, laminectomy  . BREAST SURGERY Right 2001    due to abnormal size  . TONSILLECTOMY  child    Current Outpatient Prescriptions  Medication Sig Dispense Refill  . NON FORMULARY AdvoCare Supplements daily.    . Norethindrone-Ethinyl Estradiol-Fe Biphas (LO LOESTRIN FE) 1 MG-10 MCG / 10 MCG tablet Take 1 tablet by mouth daily. 84 tablet 4  . PROAIR HFA 108 (90 Base) MCG/ACT inhaler 2 puffs as needed. for wheezing  3   No current facility-administered medications for this visit.     Family History   Problem Relation Age of Onset  . Migraines Mother   . Stroke Maternal Grandfather   . Breast cancer Maternal Aunt 60       lumpectomy, chemo, radiation    ROS:  Pertinent items are noted in HPI.  Otherwise, a comprehensive ROS was negative.  Exam:   BP 92/60 (BP Location: Right Arm, Patient Position: Sitting, Cuff Size: Normal)   Pulse (!) 54   Resp 14   Ht 5' 6.5" (1.689 m)   Wt 159 lb (72.1 kg)   BMI 25.28 kg/m  Height: 5' 6.5" (168.9 cm) Ht Readings from Last 3 Encounters:  04/14/17 5' 6.5" (1.689 m)  04/17/16 5' 6.5" (1.689 m)  04/07/16 5' 6.5" (1.689 m)    General appearance: alert, cooperative and appears stated age Head: Normocephalic, without obvious abnormality, atraumatic Neck: no adenopathy, supple, symmetrical, trachea midline and thyroid normal to inspection and palpation Lungs: clear to auscultation bilaterally Breasts: normal appearance, no masses or tenderness, No nipple retraction or dimpling, No nipple discharge or bleeding, No axillary or supraclavicular adenopathy Heart: regular rate and rhythm Abdomen: soft, non-tender; no masses,  no organomegaly Extremities: extremities normal, atraumatic, no cyanosis or edema Skin: Skin color, texture, turgor normal. No rashes or lesions Lymph nodes: Cervical, supraclavicular, and axillary nodes normal. No abnormal inguinal nodes palpated Neurologic: Grossly normal   Pelvic: External genitalia:  no lesions              Urethra:  normal appearing urethra with no masses, tenderness or lesions              Bartholin's and Skene's: normal                 Vagina: normal appearing vagina with normal color and discharge, no lesions              Cervix: no cervical motion tenderness, no lesions and nulliparous appearance              Pap taken: No. Bimanual Exam:  Uterus:  normal size, contour, position, consistency, mobility, non-tender              Adnexa: normal adnexa and no mass, fullness, tenderness                Rectovaginal: Confirms               Anus:  normal sphincter tone, no lesions  Chaperone present: yes  A:  Well Woman with normal exam  Contraception OCP desired  Family history of breast cancer MA age 70  Colonoscopy due  P:   Reviewed health and wellness pertinent to exam  Discussed risks and benefits of OCP and warning signs, desires continuance  Rx Loloestrin FE see order with instructions  Stressed importance of SBE and mammogram yearly. Patient not aware if genetic screening done, but will check with her mother.  Discussed new guidelines recommending age 65 for colonoscopy. Patient declines until 43.  Pap smear: no   counseled on breast self exam, mammography screening, use and side effects of OCP's, adequate intake of calcium and vitamin D, diet and exercise  return annually or prn  An After Visit Summary was printed and given to the patient.

## 2017-04-14 NOTE — Patient Instructions (Signed)

## 2017-04-20 ENCOUNTER — Encounter: Payer: Self-pay | Admitting: Obstetrics & Gynecology

## 2017-04-29 ENCOUNTER — Emergency Department (HOSPITAL_BASED_OUTPATIENT_CLINIC_OR_DEPARTMENT_OTHER): Payer: Commercial Managed Care - PPO

## 2017-04-29 ENCOUNTER — Emergency Department (HOSPITAL_BASED_OUTPATIENT_CLINIC_OR_DEPARTMENT_OTHER)
Admission: EM | Admit: 2017-04-29 | Discharge: 2017-04-29 | Disposition: A | Discharge status: Home / Self Care | Payer: Commercial Managed Care - PPO | Attending: Emergency Medicine | Admitting: Emergency Medicine

## 2017-04-29 ENCOUNTER — Encounter (HOSPITAL_BASED_OUTPATIENT_CLINIC_OR_DEPARTMENT_OTHER): Payer: Self-pay

## 2017-04-29 DIAGNOSIS — Z87891 Personal history of nicotine dependence: Secondary | ICD-10-CM | POA: Diagnosis not present

## 2017-04-29 DIAGNOSIS — R2241 Localized swelling, mass and lump, right lower limb: Secondary | ICD-10-CM | POA: Insufficient documentation

## 2017-04-29 DIAGNOSIS — X58XXXA Exposure to other specified factors, initial encounter: Secondary | ICD-10-CM | POA: Diagnosis not present

## 2017-04-29 DIAGNOSIS — Y999 Unspecified external cause status: Secondary | ICD-10-CM | POA: Diagnosis not present

## 2017-04-29 DIAGNOSIS — Z79899 Other long term (current) drug therapy: Secondary | ICD-10-CM | POA: Insufficient documentation

## 2017-04-29 DIAGNOSIS — M79661 Pain in right lower leg: Secondary | ICD-10-CM | POA: Diagnosis present

## 2017-04-29 DIAGNOSIS — S86811A Strain of other muscle(s) and tendon(s) at lower leg level, right leg, initial encounter: Secondary | ICD-10-CM | POA: Insufficient documentation

## 2017-04-29 DIAGNOSIS — Y929 Unspecified place or not applicable: Secondary | ICD-10-CM | POA: Insufficient documentation

## 2017-04-29 DIAGNOSIS — Y939 Activity, unspecified: Secondary | ICD-10-CM | POA: Diagnosis not present

## 2017-04-29 NOTE — ED Triage Notes (Addendum)
C/o pain to right LE x 3 days-pt states she did have injury to lower leg 10/6 but is now having pain on opposite side of leg from injury-NAD-steady limping gait

## 2017-04-29 NOTE — ED Provider Notes (Signed)
[facility] Provider Note   CSN: [id number] Arrival date & time: 04/29/17  2054     History   Chief Complaint Chief Complaint  Patient presents with  . Leg Pain    HPI Pamela Chen is a 46 y.o. female.  Pt presents to the ED today with right leg pain and swelling.  Pt did recently compete in a cross fit competition this past weekend, so she thought it was a pulled muscle.  It has not improved much, so she was worried about a DVT.  She denies any cp or sob.      Past Medical History:  Diagnosis Date  . Abnormal Pap smear of cervix 2002   while in [place] had a colpo biopsy and follow since then has been normal.    Patient Active Problem List   Diagnosis Date Noted  . Fibroid uterus 02/14/2013  . Unspecified constipation 02/14/2013    Past Surgical History:  Procedure Laterality Date  . BACK SURGERY  2010, 2013   discectomy, laminectomy  . BREAST SURGERY Right 2001    due to abnormal size  . CERVICAL BIOPSY  W/ LOOP ELECTRODE EXCISION    . TONSILLECTOMY  child    OB History    Gravida Para Term Preterm AB Living   0 0 0 0 0 0   SAB TAB Ectopic Multiple Live Births   0 0 0 0 0       Home Medications    Prior to Admission medications   Medication Sig Start Date End Date Taking? Authorizing Provider  NON FORMULARY AdvoCare Supplements daily.    [provider]  Norethindrone-Ethinyl Estradiol-Fe Biphas (LO LOESTRIN FE) 1 MG-10 MCG / 10 MCG tablet Take 1 tablet by mouth daily. 04/07/16   Kem Boroughs, FNP  PROAIR HFA 108 [phone] Base) MCG/ACT inhaler 2 puffs as needed. for wheezing 02/11/17   [provider]    Family History Family History  Problem Relation Age of Onset  . Migraines Mother   . Stroke Maternal Grandfather   . Breast cancer Maternal Aunt 60       lumpectomy, chemo, radiation    Social History Social History  Substance Use Topics  . Smoking status: Former Smoker    Types: Cigarettes    Quit date: 02/14/1993   . Smokeless tobacco: Never Used     Comment: social smoker until 2004  . Alcohol use Yes     Comment: weekly     Allergies   Cephalosporins and Tramadol   Review of Systems Review of Systems  Musculoskeletal:       Right calf pain  All other systems reviewed and are negative.    Physical Exam Updated Vital Signs BP 113/68 (BP Location: Left Arm)   Pulse 60   Temp 98.2 F (36.8 C) (Oral)   Resp 18   Ht 5\' 6"  (1.676 m)   Wt 74.4 kg (164 lb)   SpO2 100%   BMI 26.47 kg/m   Physical Exam  Constitutional: She is oriented to person, place, and time. She appears well-developed and well-nourished.  HENT:  Head: Normocephalic and atraumatic.  Right Ear: External ear normal.  Left Ear: External ear normal.  Nose: Nose normal.  Mouth/Throat: Oropharynx is clear and moist.  Eyes: Pupils are equal, round, and reactive to light. Conjunctivae and EOM are normal.  Neck: Normal range of motion. Neck supple.  Cardiovascular: Normal rate, regular rhythm, normal heart sounds and intact distal pulses.  Pulmonary/Chest: Effort normal and breath sounds normal.  Abdominal: Soft. Bowel sounds are normal.  Musculoskeletal:       Right lower leg: She exhibits tenderness and swelling.  Neurological: She is alert and oriented to person, place, and time.  Skin: Skin is warm. Capillary refill takes less than 2 seconds.  Psychiatric: She has a normal mood and affect. Her behavior is normal. Judgment and thought content normal.  Nursing note and vitals reviewed.    ED Treatments / Results  Labs (all labs ordered are listed, but only abnormal results are displayed) Labs Reviewed - No data to display  EKG  EKG Interpretation None       Radiology US Venous Img Lower Unilateral Right  Result Date: 04/29/2017 CLINICAL DATA:  Right leg injury.  Swelling and pain. EXAM: RIGHT LOWER EXTREMITY VENOUS DOPPLER ULTRASOUND TECHNIQUE: Gray-scale sonography with graded compression, as well as  color Doppler and duplex ultrasound were performed to evaluate the lower extremity deep venous systems from the level of the common femoral vein and including the common femoral, femoral, profunda femoral, popliteal and calf veins including the posterior tibial, peroneal and gastrocnemius veins when visible. The superficial great saphenous vein was also interrogated. Spectral Doppler was utilized to evaluate flow at rest and with distal augmentation maneuvers in the common femoral, femoral and popliteal veins. COMPARISON:  None. FINDINGS: Contralateral Common Femoral Vein: Respiratory phasicity is normal and symmetric with the symptomatic side. No evidence of thrombus. Normal compressibility. Common Femoral Vein: No evidence of thrombus. Normal compressibility, respiratory phasicity and response to augmentation. Saphenofemoral Junction: No evidence of thrombus. Normal compressibility and flow on color Doppler imaging. Profunda Femoral Vein: No evidence of thrombus. Normal compressibility and flow on color Doppler imaging. Femoral Vein: No evidence of thrombus. Normal compressibility, respiratory phasicity and response to augmentation. Popliteal Vein: No evidence of thrombus. Normal compressibility, respiratory phasicity and response to augmentation. Calf Veins: No evidence of thrombus. Normal compressibility and flow on color Doppler imaging. Superficial Great Saphenous Vein: No evidence of thrombus. Normal compressibility and flow on color Doppler imaging. Venous Reflux:  None. Other Findings:  None. IMPRESSION: No evidence of DVT within the right lower extremity. Electronically Signed   By: Ulyses Jarred M.D.   On: 04/29/2017 22:03    Procedures Procedures (including critical care time)  Medications Ordered in ED Medications - No data to display   Initial Impression / Assessment and Plan / ED Course  I have reviewed the triage vital signs and the nursing notes.  Pertinent labs & imaging results that  were available during my care of the patient were reviewed by me and considered in my medical decision making (see chart for details).  Pt is stable for d/c.  She knows to return if worse.  Final Clinical Impressions(s) / ED Diagnoses   Final diagnoses:  Strain of calf muscle, right, initial encounter    New Prescriptions New Prescriptions   No medications on file     Isla Pence, MD 04/29/17 2209

## 2017-05-18 ENCOUNTER — Other Ambulatory Visit: Payer: Self-pay | Admitting: *Deleted

## 2017-05-18 ENCOUNTER — Other Ambulatory Visit: Payer: Self-pay | Admitting: Certified Nurse Midwife

## 2017-05-18 DIAGNOSIS — Z3041 Encounter for surveillance of contraceptive pills: Secondary | ICD-10-CM

## 2017-05-18 DIAGNOSIS — D259 Leiomyoma of uterus, unspecified: Secondary | ICD-10-CM

## 2017-05-18 MED ORDER — NORETHIN-ETH ESTRAD-FE BIPHAS 1 MG-10 MCG / 10 MCG PO TABS: 1.0000 | ORAL_TABLET | Freq: Every day | ORAL | 4 refills | Status: DC

## 2017-05-18 NOTE — Telephone Encounter (Signed)
Order placed and sent.

## 2017-05-18 NOTE — Telephone Encounter (Signed)
Medication refill request: OCP Last AEX:  04-14-17  Next AEX: 04-27-18 Last MMG (if hormonal medication request): [id number]  Refill authorized: please advise

## 2018-04-22 ENCOUNTER — Encounter: Payer: Self-pay | Admitting: Certified Nurse Midwife

## 2018-04-22 ENCOUNTER — Ambulatory Visit: Payer: Commercial Managed Care - PPO | Admitting: Certified Nurse Midwife

## 2018-04-22 ENCOUNTER — Other Ambulatory Visit: Payer: Self-pay

## 2018-04-22 VITALS — BP 100/68 | HR 60 | Resp 16 | Ht 66.25 in | Wt 151.0 lb

## 2018-04-22 DIAGNOSIS — R198 Other specified symptoms and signs involving the digestive system and abdomen: Secondary | ICD-10-CM

## 2018-04-22 DIAGNOSIS — Z0000 Encounter for general adult medical examination without abnormal findings: Secondary | ICD-10-CM

## 2018-04-22 DIAGNOSIS — Z01419 Encounter for gynecological examination (general) (routine) without abnormal findings: Secondary | ICD-10-CM | POA: Diagnosis not present

## 2018-04-22 DIAGNOSIS — E559 Vitamin D deficiency, unspecified: Secondary | ICD-10-CM

## 2018-04-22 DIAGNOSIS — Z1211 Encounter for screening for malignant neoplasm of colon: Secondary | ICD-10-CM

## 2018-04-22 DIAGNOSIS — Z3041 Encounter for surveillance of contraceptive pills: Secondary | ICD-10-CM

## 2018-04-22 MED ORDER — NORETHIN-ETH ESTRAD-FE BIPHAS 1 MG-10 MCG / 10 MCG PO TABS: 1.0000 | ORAL_TABLET | Freq: Every day | ORAL | 4 refills | Status: DC

## 2018-04-22 NOTE — Progress Notes (Signed)
47 y.o. G0P0000 Married  Caucasian Fe here for annual exam. Contraception OCP working well. Denies warning signs with use. Has started new job and enjoying so far. Has finger prick lab with previous company, but no screening. Desires today. Eats well and exercises, still continuance with occasional irregular bowel habits. Denies blood in stool or tarry stools or diarrhea. No other health issues today.  No LMP recorded. (Menstrual status: Oral contraceptives).          Sexually active: Yes.    The current method of family planning is OCP (estrogen/progesterone).    Exercising: Yes.    crossfit, golf, bike, water fitness, run Smoker:  no  Review of Systems  Constitutional: Negative.   HENT: Negative.   Eyes: Negative.   Respiratory: Negative.   Cardiovascular: Negative.   Gastrointestinal: Negative.   Genitourinary: Negative.   Musculoskeletal: Negative.   Skin: Negative.   Neurological: Negative.   Endo/Heme/Allergies: Negative.   Psychiatric/Behavioral: Negative.     Health Maintenance: Pap:  04-07-16 neg HPV HR neg History of Abnormal Pap: yes MMG:  2019 waiting on fax Self Breast exams: no Colonoscopy:  none BMD:   none TDaP:  2016 Shingles: no Pneumonia: no Hep C and HIV: donates blood Labs: yes   reports that she quit smoking about 25 years ago. Her smoking use included cigarettes. She has never used smokeless tobacco. She reports that she drinks about 1.0 standard drinks of alcohol per week. She reports that she does not use drugs.  Past Medical History:  Diagnosis Date  . Abnormal Pap smear of cervix 2002   while in [place] had a colpo biopsy and follow since then has been normal.    Past Surgical History:  Procedure Laterality Date  . BACK SURGERY  2010, 2013   discectomy, laminectomy  . BREAST SURGERY Right 2001    due to abnormal size  . CERVICAL BIOPSY  W/ LOOP ELECTRODE EXCISION    . TONSILLECTOMY  child    Current Outpatient Medications  Medication Sig  Dispense Refill  . NON FORMULARY AdvoCare Supplements daily.    . Norethindrone-Ethinyl Estradiol-Fe Biphas (LO LOESTRIN FE) 1 MG-10 MCG / 10 MCG tablet Take 1 tablet by mouth daily. 84 tablet 4  . PROAIR HFA 108 (90 Base) MCG/ACT inhaler 2 puffs as needed. for wheezing  3   No current facility-administered medications for this visit.     Family History  Problem Relation Age of Onset  . Migraines Mother   . Stroke Maternal Grandfather   . Breast cancer Maternal Aunt 60       lumpectomy, chemo, radiation    ROS:  Pertinent items are noted in HPI.  Otherwise, a comprehensive ROS was negative.  Exam:   BP 100/68   Pulse 60   Resp 16   Ht 5' 6.25" (1.683 m)   Wt 151 lb (68.5 kg)   BMI 24.19 kg/m  Height: 5' 6.25" (168.3 cm) Ht Readings from Last 3 Encounters:  04/22/18 5' 6.25" (1.683 m)  04/29/17 5\' 6"  (1.676 m)  04/14/17 5' 6.5" (1.689 m)    General appearance: alert, cooperative and appears stated age Head: Normocephalic, without obvious abnormality, atraumatic Neck: no adenopathy, supple, symmetrical, trachea midline and thyroid normal to inspection and palpation Lungs: clear to auscultation bilaterally Breasts: normal appearance, no masses or tenderness, No nipple retraction or dimpling, No nipple discharge or bleeding, No axillary or supraclavicular adenopathy Heart: regular rate and rhythm Abdomen: soft, non-tender; no masses,  no  organomegaly Extremities: extremities normal, atraumatic, no cyanosis or edema Skin: Skin color, texture, turgor normal. No rashes or lesions Lymph nodes: Cervical, supraclavicular, and axillary nodes normal. No abnormal inguinal nodes palpated Neurologic: Grossly normal   Pelvic: External genitalia:  no lesions              Urethra:  normal appearing urethra with no masses, tenderness or lesions              Bartholin's and Skene's: normal                 Vagina: normal appearing vagina with normal color and discharge, no lesions               Cervix: no cervical motion tenderness, no lesions and normal appearance              Pap taken: No. Bimanual Exam:  Uterus:  normal size, contour, position, consistency, mobility, non-tender and anteverted              Adnexa: normal adnexa and no mass, fullness, tenderness               Rectovaginal: Confirms               Anus:  normal sphincter tone, no lesions  Chaperone present: yes  A:  Well Woman with normal exam  Contraception OCP desired  Colonoscopy due  Irregular bowel habit  Screening labs  P:   Reviewed health and wellness pertinent to exam  Risks/benefits/warning signs reviewed and discussed would need to consider another option after 50. Questions addressed.   Rx LoLoesterin Fe see order with instructions  Discussed risks/benefits of colonoscopy and would also evaluate bowel issues. Patient ready to have referral. She will be called with appointment information.  Labs: CMP, Lipid panel, Vitamin D, TSH, CBC  Pap smear: no   counseled on breast self exam, mammography screening, use and side effects of OCP's, adequate intake of calcium and vitamin D, diet and exercise  return annually or prn  An After Visit Summary was printed and given to the patient.

## 2018-04-23 LAB — LIPID PANEL
CHOL/HDL RATIO: 2.2 ratio (ref 0.0–4.4)
Cholesterol, Total: 171 mg/dL (ref 100–199)
HDL: 77 mg/dL (ref >39)
LDL CALC: 86 mg/dL (ref 0–99)
Triglycerides: 41 mg/dL (ref 0–149)
VLDL CHOLESTEROL CAL: 8 mg/dL (ref 5–40)

## 2018-04-23 LAB — CBC
HEMATOCRIT: 39.4 % (ref 34.0–46.6)
Hemoglobin: 12.8 g/dL (ref 11.1–15.9)
MCH: 32 pg (ref 26.6–33.0)
MCHC: 32.5 g/dL (ref 31.5–35.7)
MCV: 99 fL — ABNORMAL HIGH (ref 79–97)
Platelets: 248 10*3/uL (ref 150–450)
RBC: 4 x10E6/uL (ref 3.77–5.28)
RDW: 12.6 % (ref 12.3–15.4)
WBC: 6.2 10*3/uL (ref 3.4–10.8)

## 2018-04-23 LAB — COMPREHENSIVE METABOLIC PANEL
A/G RATIO: 2.3 — AB (ref 1.2–2.2)
ALBUMIN: 4.3 g/dL (ref 3.5–5.5)
ALT: 19 IU/L (ref 0–32)
AST: 26 IU/L (ref 0–40)
Alkaline Phosphatase: 41 IU/L (ref 39–117)
BILIRUBIN TOTAL: 0.7 mg/dL (ref 0.0–1.2)
BUN / CREAT RATIO: 18 (ref 9–23)
BUN: 15 mg/dL (ref 6–24)
CHLORIDE: 105 mmol/L (ref 96–106)
CO2: 22 mmol/L (ref 20–29)
Calcium: 9.4 mg/dL (ref 8.7–10.2)
Creatinine, Ser: 0.83 mg/dL (ref 0.57–1.00)
GFR calc non Af Amer: 83 mL/min/{1.73_m2} (ref >59)
GFR, EST AFRICAN AMERICAN: 96 mL/min/{1.73_m2} (ref >59)
GLUCOSE: 84 mg/dL (ref 65–99)
Globulin, Total: 1.9 g/dL (ref 1.5–4.5)
Potassium: 4.6 mmol/L (ref 3.5–5.2)
Sodium: 142 mmol/L (ref 134–144)
TOTAL PROTEIN: 6.2 g/dL (ref 6.0–8.5)

## 2018-04-23 LAB — VITAMIN D 25 HYDROXY (VIT D DEFICIENCY, FRACTURES): VIT D 25 HYDROXY: 35.2 ng/mL (ref 30.0–100.0)

## 2018-04-23 LAB — TSH: TSH: 2.02 u[IU]/mL (ref 0.450–4.500)

## 2018-04-26 ENCOUNTER — Encounter: Payer: Self-pay | Admitting: Certified Nurse Midwife

## 2018-04-27 ENCOUNTER — Ambulatory Visit: Payer: Commercial Managed Care - PPO | Admitting: Certified Nurse Midwife

## 2018-07-11 ENCOUNTER — Other Ambulatory Visit: Payer: Self-pay | Admitting: Certified Nurse Midwife

## 2018-07-11 DIAGNOSIS — Z3041 Encounter for surveillance of contraceptive pills: Secondary | ICD-10-CM

## 2018-08-13 DIAGNOSIS — M5127 Other intervertebral disc displacement, lumbosacral region: Secondary | ICD-10-CM | POA: Diagnosis not present

## 2018-08-13 DIAGNOSIS — M47817 Spondylosis without myelopathy or radiculopathy, lumbosacral region: Secondary | ICD-10-CM | POA: Diagnosis not present

## 2018-08-13 DIAGNOSIS — M5137 Other intervertebral disc degeneration, lumbosacral region: Secondary | ICD-10-CM | POA: Diagnosis not present

## 2018-08-16 DIAGNOSIS — M47817 Spondylosis without myelopathy or radiculopathy, lumbosacral region: Secondary | ICD-10-CM | POA: Diagnosis not present

## 2018-08-16 DIAGNOSIS — M5127 Other intervertebral disc displacement, lumbosacral region: Secondary | ICD-10-CM | POA: Diagnosis not present

## 2018-08-16 DIAGNOSIS — M5137 Other intervertebral disc degeneration, lumbosacral region: Secondary | ICD-10-CM | POA: Diagnosis not present

## 2018-08-30 DIAGNOSIS — R82998 Other abnormal findings in urine: Secondary | ICD-10-CM | POA: Diagnosis not present

## 2018-08-30 DIAGNOSIS — Z0000 Encounter for general adult medical examination without abnormal findings: Secondary | ICD-10-CM | POA: Diagnosis not present

## 2018-08-31 DIAGNOSIS — J4520 Mild intermittent asthma, uncomplicated: Secondary | ICD-10-CM | POA: Diagnosis not present

## 2018-08-31 DIAGNOSIS — Z0000 Encounter for general adult medical examination without abnormal findings: Secondary | ICD-10-CM | POA: Diagnosis not present

## 2018-09-01 DIAGNOSIS — Z1212 Encounter for screening for malignant neoplasm of rectum: Secondary | ICD-10-CM | POA: Diagnosis not present

## 2018-10-13 ENCOUNTER — Other Ambulatory Visit: Payer: Self-pay | Admitting: Certified Nurse Midwife

## 2018-10-13 DIAGNOSIS — Z3041 Encounter for surveillance of contraceptive pills: Secondary | ICD-10-CM

## 2018-10-13 NOTE — Telephone Encounter (Signed)
Medication refill request: Lo loestrin fe Last AEX:  05-19-2018 Next AEX: 04-27-2019 Last MMG (if hormonal medication request): 04-17-18 category b density birads 1:neg Refill authorized: patient took her last pill last night & needs a refill sent today. Patient is using local pharmacy instead of mail order that it was sent to on aex

## 2018-10-13 NOTE — Telephone Encounter (Signed)
Patient is calling regarding prescription refill.

## 2019-02-13 ENCOUNTER — Encounter (HOSPITAL_COMMUNITY): Payer: Self-pay | Admitting: Emergency Medicine

## 2019-02-13 ENCOUNTER — Emergency Department (HOSPITAL_COMMUNITY): Payer: Commercial Managed Care - PPO

## 2019-02-13 ENCOUNTER — Other Ambulatory Visit: Payer: Self-pay

## 2019-02-13 ENCOUNTER — Emergency Department (HOSPITAL_COMMUNITY)
Admission: EM | Admit: 2019-02-13 | Discharge: 2019-02-13 | Disposition: A | Discharge status: Home / Self Care | Payer: Commercial Managed Care - PPO | Attending: Emergency Medicine | Admitting: Emergency Medicine

## 2019-02-13 DIAGNOSIS — Z87891 Personal history of nicotine dependence: Secondary | ICD-10-CM | POA: Insufficient documentation

## 2019-02-13 DIAGNOSIS — R51 Headache: Secondary | ICD-10-CM | POA: Diagnosis not present

## 2019-02-13 DIAGNOSIS — R110 Nausea: Secondary | ICD-10-CM | POA: Insufficient documentation

## 2019-02-13 DIAGNOSIS — R519 Headache, unspecified: Secondary | ICD-10-CM

## 2019-02-13 MED ORDER — KETOROLAC TROMETHAMINE 30 MG/ML IJ SOLN
30.0000 mg | Freq: Once | INTRAMUSCULAR | Status: AC
  Administered 2019-02-13: 30 mg via INTRAVENOUS
  Filled 2019-02-13: qty 1

## 2019-02-13 MED ORDER — METOCLOPRAMIDE HCL 5 MG/ML IJ SOLN
10.0000 mg | Freq: Once | INTRAMUSCULAR | Status: AC
  Administered 2019-02-13: 10 mg via INTRAVENOUS
  Filled 2019-02-13: qty 2

## 2019-02-13 MED ORDER — DIPHENHYDRAMINE HCL 50 MG/ML IJ SOLN
25.0000 mg | Freq: Once | INTRAMUSCULAR | Status: AC
  Administered 2019-02-13: 25 mg via INTRAVENOUS
  Filled 2019-02-13: qty 1

## 2019-02-13 MED ORDER — SODIUM CHLORIDE 0.9 % IV BOLUS
500.0000 mL | Freq: Once | INTRAVENOUS | Status: AC
  Administered 2019-02-13: 500 mL via INTRAVENOUS

## 2019-02-13 NOTE — Discharge Instructions (Signed)
Please follow-up with your doctor if your headaches become recurrent.  Please return to the emergency department if you develop any new or worsening symptoms including worsening headache, vomiting, vision changes, weakness, seizures, or any other concerning symptoms.

## 2019-02-13 NOTE — ED Triage Notes (Signed)
Pt here for eval of headache since yesterday. Pt endorses some nausea this morning that resolved with some looser stool than normal. No abdominal pain. Unknown covid exposure.

## 2019-02-13 NOTE — ED Provider Notes (Signed)
[facility] EMERGENCY DEPARTMENT Provider Note   CSN: [id number] Arrival date & time: 02/13/19  0805    History   Chief Complaint Chief Complaint  Patient presents with  . Headache    HPI Pamela Chen is a 48 y.o. female who presents with a 2-day history of headache.  Patient describes her pain as frontal and throbbing.  She reports she woke up with a headache yesterday morning and it has been constant since.  She does not normally get headaches.  She states occasionally she will get a mild headache that she will take an ibuprofen or Tylenol and it goes away.  She has been taking those medications with no relief at all.  She has had associated nausea and some loose stools.  She denies any vision changes, numbness or tingling, fevers, neck pain, abdominal pain, vomiting.  Patient reports she had drank some beer a couple nights ago and thought that made her head hurt as some beers have given her headache in the past, however it never lasts this long.     HPI  Past Medical History:  Diagnosis Date  . Abnormal Pap smear of cervix 2002   while in [place] had a colpo biopsy and follow since then has been normal.    Patient Active Problem List   Diagnosis Date Noted  . Fibroid uterus 02/14/2013  . Unspecified constipation 02/14/2013    Past Surgical History:  Procedure Laterality Date  . BACK SURGERY  2010, 2013   discectomy, laminectomy  . BREAST SURGERY Right 2001    due to abnormal size  . CERVICAL BIOPSY  W/ LOOP ELECTRODE EXCISION    . TONSILLECTOMY  child     OB History    Gravida  0   Para  0   Term  0   Preterm  0   AB  0   Living  0     SAB  0   TAB  0   Ectopic  0   Multiple  0   Live Births  0            Home Medications    Prior to Admission medications   Medication Sig Start Date End Date Taking? Authorizing Provider  LO LOESTRIN FE 1 MG-10 MCG / 10 MCG tablet TAKE 1 TABLET BY MOUTH EVERY DAY 10/13/18   Salvadore Dom, MD  NON FORMULARY AdvoCare Supplements daily.    [provider]  PROAIR HFA 108 [phone] Base) MCG/ACT inhaler 2 puffs as needed. for wheezing 02/11/17   [provider]    Family History Family History  Problem Relation Age of Onset  . Migraines Mother   . Stroke Maternal Grandfather   . Breast cancer Maternal Aunt 60       lumpectomy, chemo, radiation    Social History Social History   Tobacco Use  . Smoking status: Former Smoker    Types: Cigarettes    Quit date: 02/14/1993    Years since quitting: 26.0  . Smokeless tobacco: Never Used  . Tobacco comment: social smoker until 2004  Substance Use Topics  . Alcohol use: Yes    Alcohol/week: 1.0 standard drinks    Types: 1 Standard drinks or equivalent per week  . Drug use: No     Allergies   Cephalosporins and Tramadol   Review of Systems Review of Systems  Constitutional: Negative for chills and fever.  HENT: Negative for facial swelling and sore throat.  Eyes: Negative for visual disturbance.  Respiratory: Negative for shortness of breath.   Cardiovascular: Negative for chest pain.  Gastrointestinal: Positive for diarrhea and nausea. Negative for abdominal pain and vomiting.  Genitourinary: Negative for dysuria.  Musculoskeletal: Negative for back pain and neck pain.  Skin: Negative for rash and wound.  Neurological: Positive for headaches.  Psychiatric/Behavioral: The patient is not nervous/anxious.      Physical Exam Updated Vital Signs BP 117/69 (BP Location: Right Arm)   Pulse 75   Temp 98 F (36.7 C) (Oral)   Resp 16   SpO2 100%   Physical Exam Vitals signs and nursing note reviewed.  Constitutional:      General: She is not in acute distress.    Appearance: She is well-developed. She is not diaphoretic.  HENT:     Head: Normocephalic and atraumatic.     Comments: No tenderness, swelling or erythema noted to the temporal region    Mouth/Throat:     Pharynx: No  oropharyngeal exudate.  Eyes:     General: No scleral icterus.       Right eye: No discharge.        Left eye: No discharge.     Extraocular Movements: Extraocular movements intact.     Conjunctiva/sclera: Conjunctivae normal.     Pupils: Pupils are equal, round, and reactive to light.  Neck:     Musculoskeletal: Full passive range of motion without pain, normal range of motion and neck supple. No neck rigidity, pain with movement, spinous process tenderness or muscular tenderness.     Thyroid: No thyromegaly.     Comments: No difficulty on chin to chest Cardiovascular:     Rate and Rhythm: Normal rate and regular rhythm.     Heart sounds: Normal heart sounds. No murmur. No friction rub. No gallop.   Pulmonary:     Effort: Pulmonary effort is normal. No respiratory distress.     Breath sounds: Normal breath sounds. No stridor. No wheezing or rales.  Abdominal:     General: Bowel sounds are normal. There is no distension.     Palpations: Abdomen is soft.     Tenderness: There is no abdominal tenderness. There is no guarding or rebound.  Lymphadenopathy:     Cervical: No cervical adenopathy.  Skin:    General: Skin is warm and dry.     Coloration: Skin is not pale.     Findings: No rash.  Neurological:     Mental Status: She is alert.     GCS: GCS eye subscore is 4. GCS verbal subscore is 5. GCS motor subscore is 6.     Coordination: Coordination normal.     Comments: CN 3-12 intact; normal sensation throughout; 5/5 strength in all 4 extremities; equal bilateral grip strength; no ataxia on finger-to-nose      ED Treatments / Results  Labs (all labs ordered are listed, but only abnormal results are displayed) Labs Reviewed - No data to display  EKG None  Radiology Ct Head Wo Contrast  Result Date: 02/13/2019 CLINICAL DATA:  48 year old female with history of headache since yesterday. Nausea this morning. EXAM: CT HEAD WITHOUT CONTRAST TECHNIQUE: Contiguous axial images  were obtained from the base of the skull through the vertex without intravenous contrast. COMPARISON:  None. FINDINGS: Brain: No evidence of acute infarction, hemorrhage, hydrocephalus, extra-axial collection or mass lesion/mass effect. Vascular: No hyperdense vessel or unexpected calcification. Skull: Normal. Negative for fracture or focal lesion. Sinuses/Orbits: No acute finding.  Other: None. IMPRESSION: 1. No acute intracranial abnormalities. The appearance of the brain is normal. Electronically Signed   By: Vinnie Langton M.D.   On: 02/13/2019 09:14    Procedures Procedures (including critical care time)  Medications Ordered in ED Medications  sodium chloride 0.9 % bolus 500 mL (500 mLs Intravenous New Bag/Given 02/13/19 0932)  ketorolac (TORADOL) 30 MG/ML injection 30 mg (30 mg Intravenous Given 02/13/19 0932)  metoCLOPramide (REGLAN) injection 10 mg (10 mg Intravenous Given 02/13/19 0932)  diphenhydrAMINE (BENADRYL) injection 25 mg (25 mg Intravenous Given 02/13/19 0932)     Initial Impression / Assessment and Plan / ED Course  I have reviewed the triage vital signs and the nursing notes.  Pertinent labs & imaging results that were available during my care of the patient were reviewed by me and considered in my medical decision making (see chart for details).        Patient presenting with a headache for the last 2 days.  She does not normally get headaches.  She reports associated nausea and some diarrhea.  She denies any fever or neck pain.  Low suspicion of SAH, meningitis, temporal arteritis.  Normal neuro exam without focal deficits, however considering new, different headache and a 48 year old, CT head was obtained which was normal.  Patient is feeling much better after IV fluids, Toradol, Reglan, Benadryl.  She would like to go home and rest.  Patient is advised to follow-up with her PCP if her headaches become recurrent.  Strict return precautions given.  Patient understands and  agrees with plan.  Patient vital stable throughout ED course and discharged in satisfactory condition.  Final Clinical Impressions(s) / ED Diagnoses   Final diagnoses:  Bad headache    ED Discharge Orders    None       Frederica Kuster, PA-C 02/13/19 1010    Lajean Saver, MD 02/13/19 1259

## 2019-04-12 ENCOUNTER — Telehealth: Payer: Self-pay | Admitting: Certified Nurse Midwife

## 2019-04-12 DIAGNOSIS — Z1211 Encounter for screening for malignant neoplasm of colon: Secondary | ICD-10-CM

## 2019-04-12 DIAGNOSIS — R198 Other specified symptoms and signs involving the digestive system and abdomen: Secondary | ICD-10-CM

## 2019-04-12 NOTE — Telephone Encounter (Signed)
Left message to call Sharee Pimple, RN at [facility].    Per review of 04/22/18 AEX,  referral was placed to Dr. Collene Mares for colonoscopy and irregular bowel habits.

## 2019-04-12 NOTE — Telephone Encounter (Signed)
Spoke with patient. Patient request referral for colonoscopy to Dr. Collene Mares. Patient went after referral was placed 04/2018, was advised by insurance too early. Advised I have placed a new referral to Dr. Collene Mares, our office referral coordinator will f/u with appt details once scheduled. Patient agreeable.   Routing to provider for final review. Patient is agreeable to disposition. Will close encounter.  Cc: Pamela Chen

## 2019-04-12 NOTE — Telephone Encounter (Signed)
Patient is calling regarding colonoscopy. Patient does not remember where she was told to go to receive a colonoscopy.

## 2019-04-22 ENCOUNTER — Encounter: Payer: Self-pay | Admitting: Obstetrics & Gynecology

## 2019-04-25 ENCOUNTER — Other Ambulatory Visit: Payer: Self-pay

## 2019-04-26 NOTE — Progress Notes (Signed)
48 y.o. G0P0000 Married  Caucasian Fe here for annual exam. Periods none with OCP. OCP working well, would like to continue during perimenopausal changes. Denies any hot flashes or night sweats. Had a migraine that required ER visit, no aura, but has never had this type of headache. Was given medication and evaluation. None since that occurrence. See Dr. Osborne Casco for aex, labs and inhaler management. No other health changes. Took trip to [place] and had snow. Will be moving out of area possibly.  No LMP recorded. (Menstrual status: Oral contraceptives).          Sexually active: Yes.    The current method of family planning is OCP (estrogen/progesterone).    Exercising: Yes.    crossfit, pilates, bike Smoker:  no  Review of Systems  Constitutional: Negative.   HENT:       Headache  Eyes: Negative.   Respiratory: Negative.   Cardiovascular: Negative.   Gastrointestinal: Negative.   Genitourinary: Negative.   Musculoskeletal: Negative.   Skin: Negative.   Neurological: Negative.   Endo/Heme/Allergies: Negative.   Psychiatric/Behavioral: Negative.     Health Maintenance: Pap:  04-07-16 neg HPV HR neg History of Abnormal Pap: yes MMG:  Last week, normal per patient Self Breast exams: no Colonoscopy:  None, scheduled for the 21st of october BMD:   none TDaP:  2016 Shingles: not done Pneumonia: not done Hep C and HIV: donates blood Labs: if needed   reports that she quit smoking about 26 years ago. Her smoking use included cigarettes. She has never used smokeless tobacco. She reports current alcohol use of about 1.0 - 2.0 standard drinks of alcohol per week. She reports that she does not use drugs.  Past Medical History:  Diagnosis Date  . Abnormal Pap smear of cervix 2002   while in [place] had a colpo biopsy and follow since then has been normal.    Past Surgical History:  Procedure Laterality Date  . BACK SURGERY  2010, 2013   discectomy, laminectomy  . BREAST SURGERY Right 2001     due to abnormal size  . CERVICAL BIOPSY  W/ LOOP ELECTRODE EXCISION    . TONSILLECTOMY  child    Current Outpatient Medications  Medication Sig Dispense Refill  . LO LOESTRIN FE 1 MG-10 MCG / 10 MCG tablet TAKE 1 TABLET BY MOUTH EVERY DAY 84 tablet 2  . NON FORMULARY AdvoCare Supplements daily.    Marland Kitchen PROAIR HFA 108 (90 Base) MCG/ACT inhaler 2 puffs as needed. for wheezing  3   No current facility-administered medications for this visit.     Family History  Problem Relation Age of Onset  . Migraines Mother   . Stroke Maternal Grandfather   . Breast cancer Maternal Aunt 60       lumpectomy, chemo, radiation    ROS:  Pertinent items are noted in HPI.  Otherwise, a comprehensive ROS was negative.  Exam:   BP 102/60   Pulse 64   Temp (!) 97.2 F (36.2 C) (Skin)   Resp 16   Ht 5' 6.5" (1.689 m)   Wt 154 lb (69.9 kg)   BMI 24.48 kg/m  Height: 5' 6.5" (168.9 cm) Ht Readings from Last 3 Encounters:  04/27/19 5' 6.5" (1.689 m)  04/22/18 5' 6.25" (1.683 m)  04/29/17 5\' 6"  (1.676 m)    General appearance: alert, cooperative and appears stated age Head: Normocephalic, without obvious abnormality, atraumatic Neck: no adenopathy, supple, symmetrical, trachea midline and thyroid normal to  inspection and palpation Lungs: clear to auscultation bilaterally Breasts: normal appearance, no masses or tenderness, No nipple retraction or dimpling, No nipple discharge or bleeding, No axillary or supraclavicular adenopathy, scarring from lumpectomy noted on left Heart: regular rate and rhythm Abdomen: soft, non-tender; no masses,  no organomegaly Extremities: extremities normal, atraumatic, no cyanosis or edema Skin: Skin color, texture, turgor normal. No rashes or lesions Lymph nodes: Cervical, supraclavicular, and axillary nodes normal. No abnormal inguinal nodes palpated Neurologic: Grossly normal   Pelvic: External genitalia:  no lesions, normal female              Urethra:  normal  appearing urethra with no masses, tenderness or lesions              Bartholin's and Skene's: normal                 Vagina: normal appearing vagina with normal color and discharge, no lesions              Cervix: no cervical motion tenderness, no lesions and normal appearance              Pap taken: Yes.   Bimanual Exam:  Uterus:  normal size, contour, position, consistency, mobility, non-tender and anteverted              Adnexa: normal adnexa and no mass, fullness, tenderness               Rectovaginal: Confirms               Anus:  normal sphincter tone, no lesions  Chaperone present: yes  A:  Well Woman with normal exam  Contraception OCP desired  Recent treatment for migraine in ER, no aura, but never migraines.  PCP management of labs and inhaler  History of abnormal pap smear with LEEP  Family history of breast cancer mother 67    P:   Reviewed health and wellness pertinent to exam  Discussed due to nature of headache and age of 48 would recommend changing OCP to Progesterone only, to decrease risk with estrogen use. Bleeding profile discussed.Questions addressed. Agrees this is better choice.   Rx Micronor see order with instructions to start now  Reviewed Migraine headache warning signs and need to seek assistance if occurs.  Given booklet on perimenopausal changes if occurs.  Continue with PCP as indicated.  Stressed importance of yearly mammogram and SBE.  Pap smear: yes   counseled on breast self exam, mammography screening, feminine hygiene, adequate intake of calcium and vitamin D, diet and exercise  return annually or prn  An After Visit Summary was printed and given to the patient.

## 2019-04-27 ENCOUNTER — Ambulatory Visit (INDEPENDENT_AMBULATORY_CARE_PROVIDER_SITE_OTHER): Payer: Commercial Managed Care - PPO | Admitting: Certified Nurse Midwife

## 2019-04-27 ENCOUNTER — Encounter: Payer: Self-pay | Admitting: Certified Nurse Midwife

## 2019-04-27 ENCOUNTER — Other Ambulatory Visit (HOSPITAL_COMMUNITY)
Admission: RE | Admit: 2019-04-27 | Discharge: 2019-04-27 | Disposition: A | Discharge status: Home / Self Care | Payer: Commercial Managed Care - PPO | Source: Ambulatory Visit | Attending: Certified Nurse Midwife | Admitting: Certified Nurse Midwife

## 2019-04-27 ENCOUNTER — Other Ambulatory Visit: Payer: Self-pay

## 2019-04-27 VITALS — BP 102/60 | HR 64 | Temp 97.2°F | Resp 16 | Ht 66.5 in | Wt 154.0 lb

## 2019-04-27 DIAGNOSIS — Z124 Encounter for screening for malignant neoplasm of cervix: Secondary | ICD-10-CM | POA: Diagnosis not present

## 2019-04-27 DIAGNOSIS — Z01419 Encounter for gynecological examination (general) (routine) without abnormal findings: Secondary | ICD-10-CM

## 2019-04-27 DIAGNOSIS — Z30011 Encounter for initial prescription of contraceptive pills: Secondary | ICD-10-CM | POA: Diagnosis not present

## 2019-04-27 MED ORDER — NORETHINDRONE 0.35 MG PO TABS: 1.0000 | ORAL_TABLET | Freq: Every day | ORAL | 3 refills | Status: DC

## 2019-04-27 NOTE — Patient Instructions (Signed)

## 2019-05-06 LAB — CYTOLOGY - PAP
Comment: NEGATIVE
Diagnosis: NEGATIVE
High risk HPV: NEGATIVE

## 2019-06-07 ENCOUNTER — Other Ambulatory Visit: Payer: Self-pay | Admitting: Obstetrics and Gynecology

## 2019-06-07 DIAGNOSIS — Z3041 Encounter for surveillance of contraceptive pills: Secondary | ICD-10-CM

## 2019-06-07 NOTE — Telephone Encounter (Signed)
Med refill request: LoLoestrin Last AEX: 04/27/2019 Next AEX: not scheduled Last MMG (if hormonal med) 04/22/2019 Refill authorized: Please Advise? Possibly needs to change to micronor per OV on 04/27/19?

## 2019-06-22 NOTE — Telephone Encounter (Signed)
Left pt message to call back and confirm Rx.

## 2019-06-27 NOTE — Telephone Encounter (Signed)
Left message for pt to call back to triage for Rx confirmation.

## 2019-10-12 ENCOUNTER — Encounter: Payer: Self-pay | Admitting: Certified Nurse Midwife

## 2020-03-16 IMAGING — CT CT HEAD WITHOUT CONTRAST
3 series · 16 of 47 positions shown, 19 images · non-contrast
Comparison: None.

CLINICAL DATA: 48-year-old female with history of headache since
yesterday. Nausea this morning.

EXAM:
CT HEAD WITHOUT CONTRAST
TECHNIQUE: Contiguous axial images were obtained from the base of the skull
through the vertex without intravenous contrast.

[Series 3: head 5.0 h30s · axial · 0.45mm/px · z∈[-96,+49]mm · 10 of 35 slices shown, 13 images]
[im 3/35  brain]
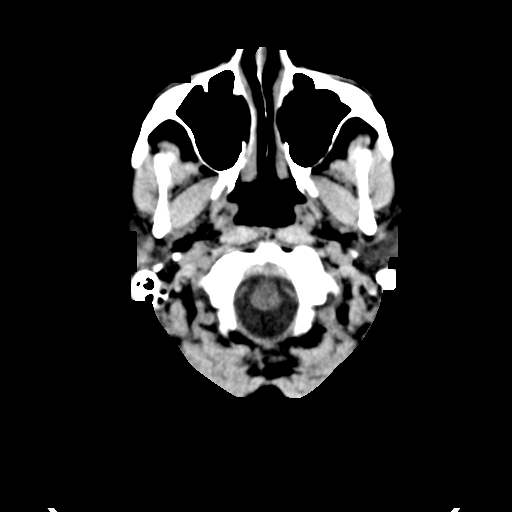
[im 3/35  bone]
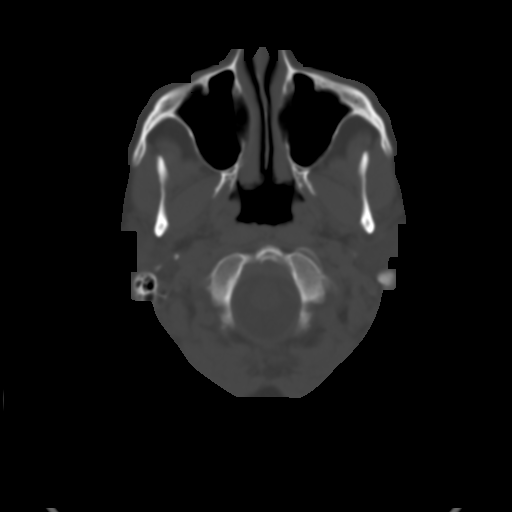
[im 6/35  brain]
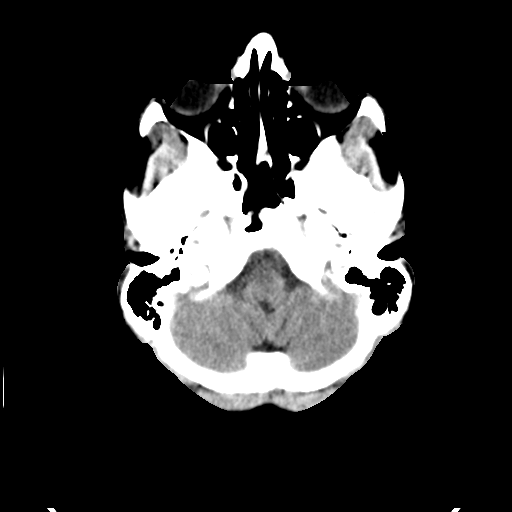
[im 10/35  brain]
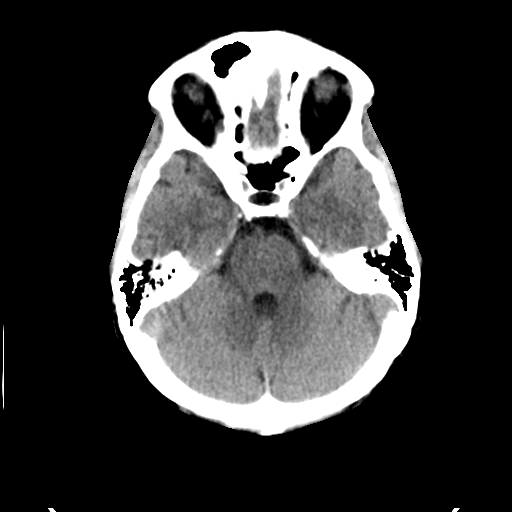
[im 12/35  brain]
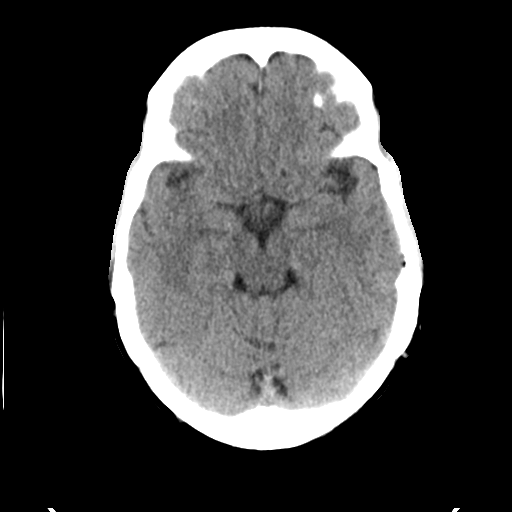
[im 16/35  brain]
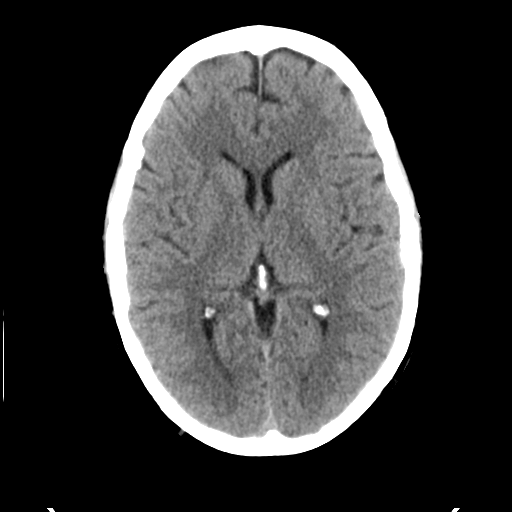
[im 16/35  bone]
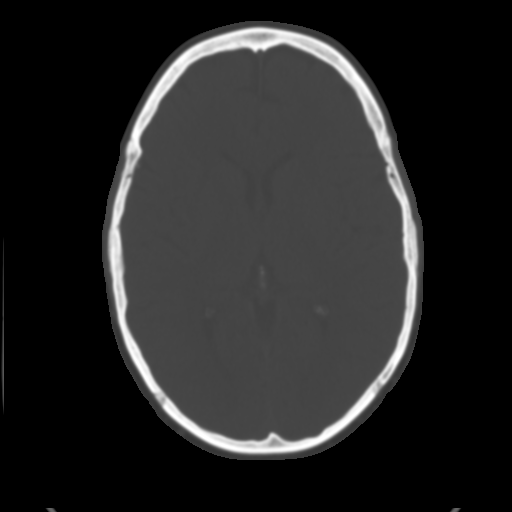
[im 19/35  brain]
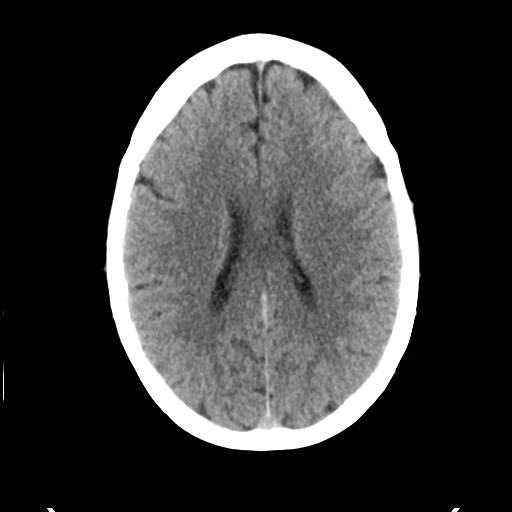
[im 23/35  brain]
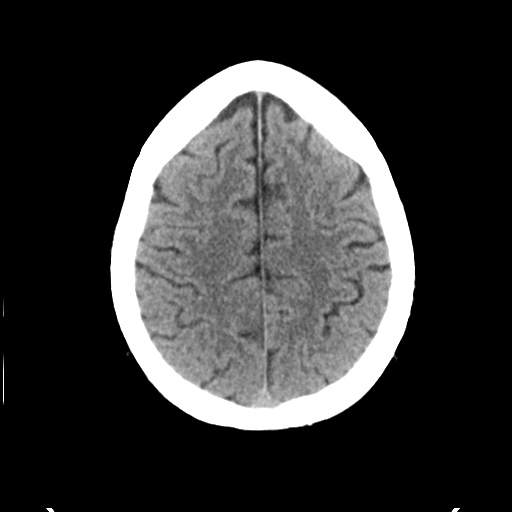
[im 26/35  brain]
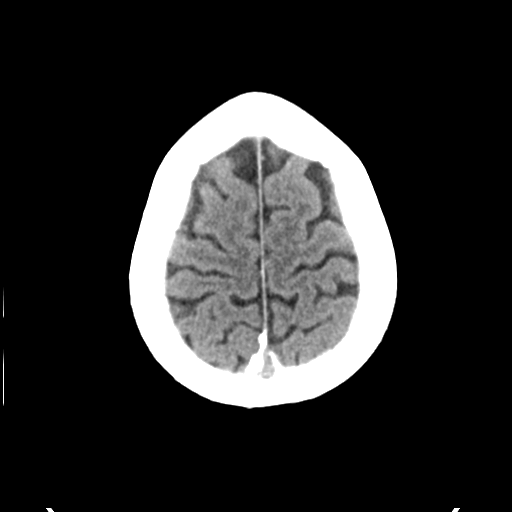
[im 29/35  brain]
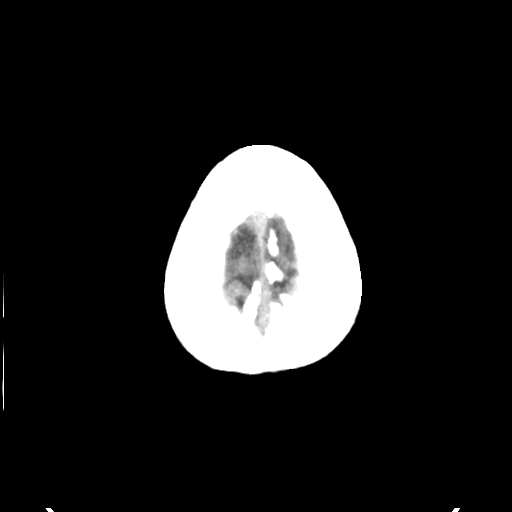
[im 29/35  bone]
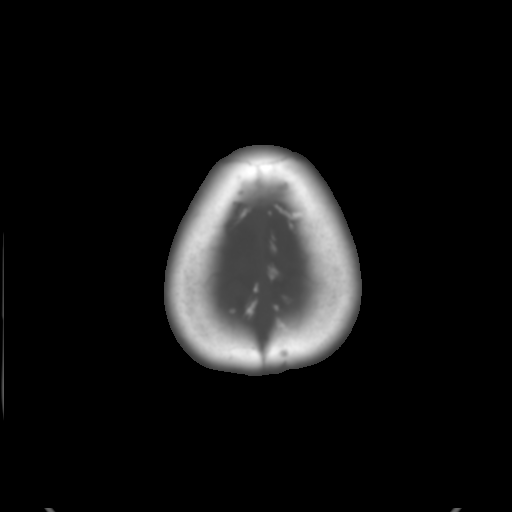
[im 32/35  brain]
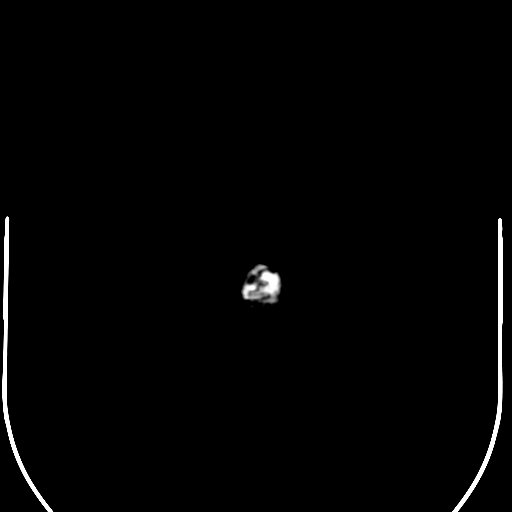

[Series 5: head 3.0 mpr cor · coronal · 0.35mm/px · 3 of 75 slices shown]
[im 25/75  brain]
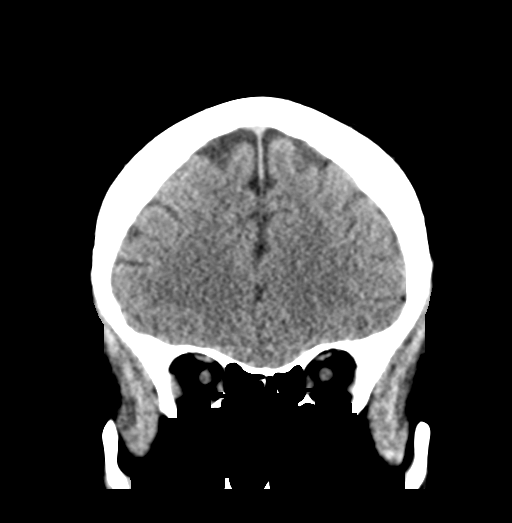
[im 33/75  brain]
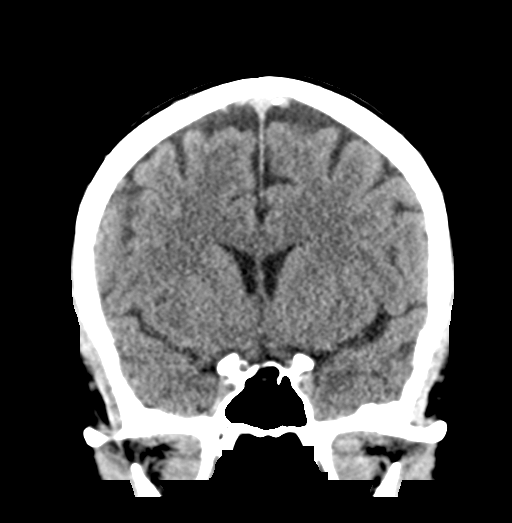
[im 42/75  brain]
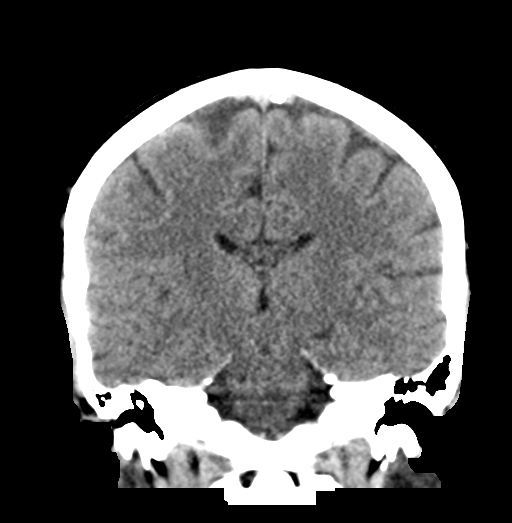

[Series 6: head 3.0 mpr sag · sagittal · 0.33mm/px · 3 of 67 slices shown]
[im 23/67  brain]
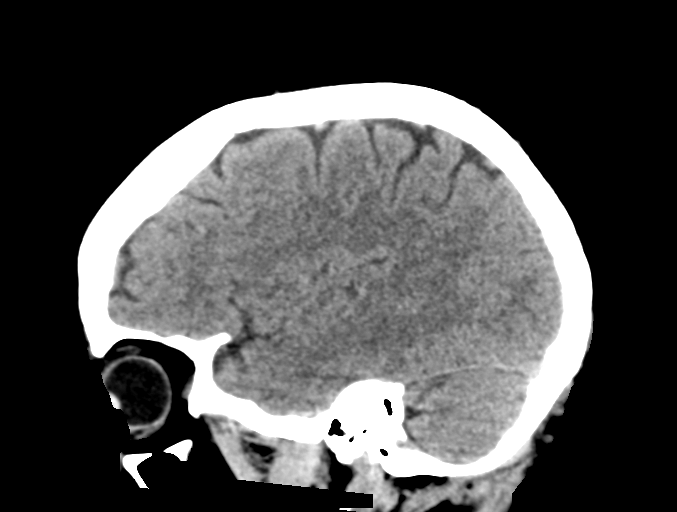
[im 34/67  brain]
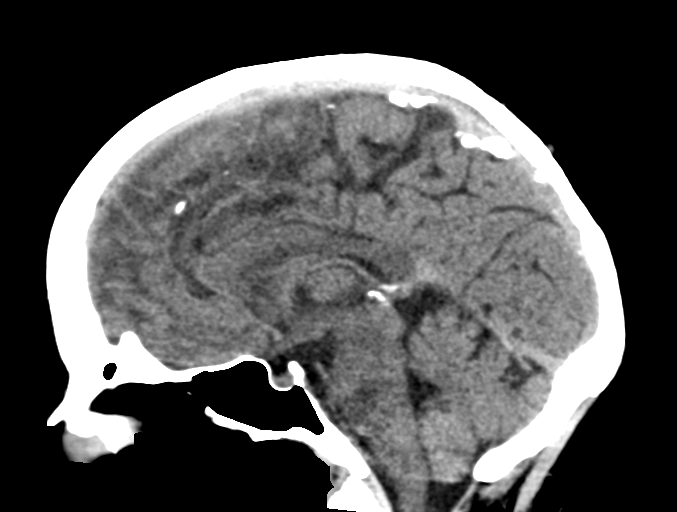
[im 45/67  brain]
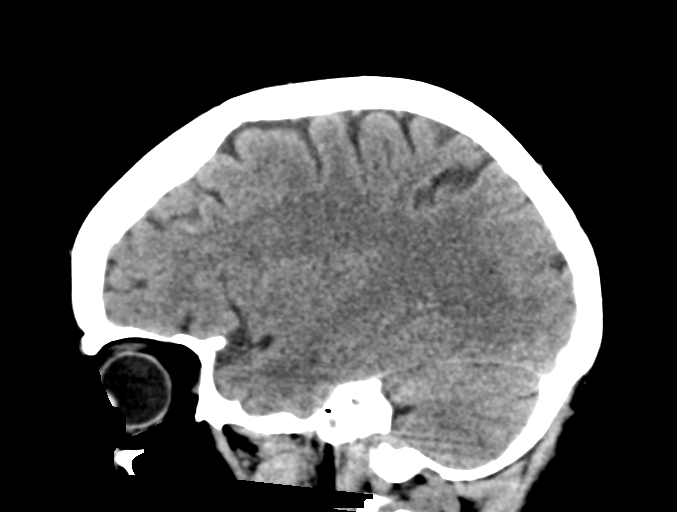

[16 of 47 positions shown; findings below may reference images not displayed]

FINDINGS: Brain: No evidence of acute infarction, hemorrhage, hydrocephalus,
extra-axial collection or mass lesion/mass effect.

Vascular: No hyperdense vessel or unexpected calcification.

Skull: Normal. Negative for fracture or focal lesion.

Sinuses/Orbits: No acute finding.

Other: None.
IMPRESSION: 1. No acute intracranial abnormalities. The appearance of the brain
is normal.

## 2020-03-22 ENCOUNTER — Other Ambulatory Visit: Payer: Self-pay | Admitting: Obstetrics and Gynecology

## 2020-03-22 DIAGNOSIS — Z30011 Encounter for initial prescription of contraceptive pills: Secondary | ICD-10-CM

## 2020-03-22 MED ORDER — NORETHINDRONE 0.35 MG PO TABS: 1.0000 | ORAL_TABLET | Freq: Every day | ORAL | 0 refills | Status: AC

## 2020-03-22 NOTE — Telephone Encounter (Signed)
Patient requesting refill on birth control. [facility] in [place] [phone].

## 2020-03-22 NOTE — Telephone Encounter (Signed)
Medication refill request: OCP Last AEX:  04/27/19 DL Next AEX: none scheduled -- will contact to schedule Last MMG (if hormonal medication request): 04/22/19 BIRADS 1 negative/density c Refill authorized: Today, please advise

## 2020-06-12 ENCOUNTER — Other Ambulatory Visit: Payer: Self-pay | Admitting: Obstetrics and Gynecology

## 2020-06-12 DIAGNOSIS — Z30011 Encounter for initial prescription of contraceptive pills: Secondary | ICD-10-CM
